# Patient Record
Sex: Female | Born: 1965
Health system: Southern US, Community
[De-identification: ages and names within clinical notes are randomized; demographics above are authoritative.]

## PROBLEM LIST (undated history)

## (undated) DIAGNOSIS — D72819 Decreased white blood cell count, unspecified: Secondary | ICD-10-CM

## (undated) DIAGNOSIS — J45909 Unspecified asthma, uncomplicated: Secondary | ICD-10-CM

## (undated) DIAGNOSIS — D649 Anemia, unspecified: Secondary | ICD-10-CM

## (undated) HISTORY — DX: Decreased white blood cell count, unspecified: D72.819

## (undated) HISTORY — PX: OTHER SURGICAL HISTORY: SHX169

## (undated) HISTORY — PX: COLONOSCOPY: SHX174

## (undated) HISTORY — DX: Unspecified asthma, uncomplicated: J45.909

## (undated) HISTORY — PX: BREAST EXCISIONAL BIOPSY: SUR124

---

## 1991-11-07 HISTORY — PX: DILATION AND CURETTAGE OF UTERUS: SHX78

## 1998-02-08 ENCOUNTER — Other Ambulatory Visit: Admission: RE | Admit: 1998-02-08 | Discharge: 1998-02-08 | Payer: Self-pay | Admitting: Gynecology

## 1999-02-24 ENCOUNTER — Other Ambulatory Visit: Admission: RE | Admit: 1999-02-24 | Discharge: 1999-02-24 | Payer: Self-pay | Admitting: Gynecology

## 1999-09-28 ENCOUNTER — Ambulatory Visit (HOSPITAL_COMMUNITY): Admission: RE | Admit: 1999-09-28 | Discharge: 1999-09-28 | Payer: Self-pay | Admitting: Gynecology

## 1999-09-28 ENCOUNTER — Encounter (INDEPENDENT_AMBULATORY_CARE_PROVIDER_SITE_OTHER): Payer: Self-pay

## 2001-01-22 ENCOUNTER — Other Ambulatory Visit: Admission: RE | Admit: 2001-01-22 | Discharge: 2001-01-22 | Payer: Self-pay | Admitting: Gynecology

## 2001-07-30 ENCOUNTER — Other Ambulatory Visit: Admission: RE | Admit: 2001-07-30 | Discharge: 2001-07-30 | Payer: Self-pay | Admitting: Gynecology

## 2001-10-25 ENCOUNTER — Encounter: Admission: RE | Admit: 2001-10-25 | Discharge: 2001-10-25 | Payer: Self-pay | Admitting: Gynecology

## 2001-10-25 ENCOUNTER — Encounter: Payer: Self-pay | Admitting: Gynecology

## 2002-11-13 ENCOUNTER — Other Ambulatory Visit: Admission: RE | Admit: 2002-11-13 | Discharge: 2002-11-13 | Payer: Self-pay | Admitting: Gynecology

## 2004-03-14 ENCOUNTER — Other Ambulatory Visit: Admission: RE | Admit: 2004-03-14 | Discharge: 2004-03-14 | Payer: Self-pay | Admitting: Gynecology

## 2004-03-24 ENCOUNTER — Encounter: Admission: RE | Admit: 2004-03-24 | Discharge: 2004-03-24 | Payer: Self-pay | Admitting: Gynecology

## 2005-03-21 ENCOUNTER — Other Ambulatory Visit: Admission: RE | Admit: 2005-03-21 | Discharge: 2005-03-21 | Payer: Self-pay | Admitting: Gynecology

## 2005-03-27 ENCOUNTER — Encounter: Admission: RE | Admit: 2005-03-27 | Discharge: 2005-03-27 | Payer: Self-pay | Admitting: Gynecology

## 2006-03-26 ENCOUNTER — Other Ambulatory Visit: Admission: RE | Admit: 2006-03-26 | Discharge: 2006-03-26 | Payer: Self-pay | Admitting: Gynecology

## 2006-04-03 ENCOUNTER — Encounter: Admission: RE | Admit: 2006-04-03 | Discharge: 2006-04-03 | Payer: Self-pay | Admitting: Gynecology

## 2007-04-02 ENCOUNTER — Other Ambulatory Visit: Admission: RE | Admit: 2007-04-02 | Discharge: 2007-04-02 | Payer: Self-pay | Admitting: Gynecology

## 2007-04-05 ENCOUNTER — Encounter: Admission: RE | Admit: 2007-04-05 | Discharge: 2007-04-05 | Payer: Self-pay | Admitting: Gynecology

## 2008-04-06 ENCOUNTER — Encounter: Admission: RE | Admit: 2008-04-06 | Discharge: 2008-04-06 | Payer: Self-pay | Admitting: Gynecology

## 2008-04-14 ENCOUNTER — Encounter: Admission: RE | Admit: 2008-04-14 | Discharge: 2008-04-14 | Payer: Self-pay | Admitting: Gynecology

## 2009-04-06 ENCOUNTER — Encounter: Admission: RE | Admit: 2009-04-06 | Discharge: 2009-04-06 | Payer: Self-pay | Admitting: Gynecology

## 2010-04-08 ENCOUNTER — Encounter: Admission: RE | Admit: 2010-04-08 | Discharge: 2010-04-08 | Payer: Self-pay | Admitting: Gynecology

## 2011-03-07 ENCOUNTER — Other Ambulatory Visit: Payer: Self-pay | Admitting: Gynecology

## 2011-03-07 DIAGNOSIS — Z1231 Encounter for screening mammogram for malignant neoplasm of breast: Secondary | ICD-10-CM

## 2011-04-11 ENCOUNTER — Ambulatory Visit: Payer: Self-pay

## 2011-04-26 ENCOUNTER — Ambulatory Visit
Admission: RE | Admit: 2011-04-26 | Discharge: 2011-04-26 | Disposition: A | Discharge status: Home / Self Care | Payer: 59 | Source: Ambulatory Visit | Attending: Gynecology | Admitting: Gynecology

## 2011-04-26 DIAGNOSIS — Z1231 Encounter for screening mammogram for malignant neoplasm of breast: Secondary | ICD-10-CM

## 2011-08-28 ENCOUNTER — Other Ambulatory Visit: Payer: Self-pay | Admitting: Gynecology

## 2011-08-28 DIAGNOSIS — N6009 Solitary cyst of unspecified breast: Secondary | ICD-10-CM

## 2011-09-07 ENCOUNTER — Ambulatory Visit
Admission: RE | Admit: 2011-09-07 | Discharge: 2011-09-07 | Disposition: A | Discharge status: Home / Self Care | Payer: 59 | Source: Ambulatory Visit | Attending: Gynecology | Admitting: Gynecology

## 2011-09-07 DIAGNOSIS — N6009 Solitary cyst of unspecified breast: Secondary | ICD-10-CM

## 2012-07-31 ENCOUNTER — Other Ambulatory Visit: Payer: Self-pay | Admitting: Gynecology

## 2012-07-31 DIAGNOSIS — Z1231 Encounter for screening mammogram for malignant neoplasm of breast: Secondary | ICD-10-CM

## 2012-08-22 ENCOUNTER — Encounter (HOSPITAL_BASED_OUTPATIENT_CLINIC_OR_DEPARTMENT_OTHER): Payer: Self-pay | Admitting: *Deleted

## 2012-08-23 ENCOUNTER — Encounter (HOSPITAL_BASED_OUTPATIENT_CLINIC_OR_DEPARTMENT_OTHER): Payer: Self-pay | Admitting: *Deleted

## 2012-08-23 NOTE — Progress Notes (Signed)
NPO AFTER MN. ARRIVES AT 0600. NEEDS CBC AND T  & S. REVIEWED [facility] GUIDELINES.

## 2012-08-25 NOTE — Anesthesia Preprocedure Evaluation (Addendum)
Anesthesia Evaluation  Patient identified by MRN, date of birth, ID band Patient awake    Reviewed: Allergy & Precautions, H&P , NPO status , Patient's Chart, lab work & pertinent test results  Airway Mallampati: II TM Distance: >3 FB Neck ROM: Full    Dental No notable dental hx.    Pulmonary neg pulmonary ROS,  breath sounds clear to auscultation  Pulmonary exam normal       Cardiovascular negative cardio ROS  Rhythm:Regular Rate:Normal  Hemoglobin 9.7   Neuro/Psych negative neurological ROS  negative psych ROS   GI/Hepatic negative GI ROS, Neg liver ROS,   Endo/Other  negative endocrine ROS  Renal/GU negative Renal ROS  negative genitourinary   Musculoskeletal negative musculoskeletal ROS (+)   Abdominal   Peds negative pediatric ROS (+)  Hematology negative hematology ROS (+)   Anesthesia Other Findings   Reproductive/Obstetrics negative OB ROS                          Anesthesia Physical Anesthesia Plan  ASA: I  Anesthesia Plan: General   Post-op Pain Management:    Induction: Intravenous  Airway Management Planned: Oral ETT  Additional Equipment:   Intra-op Plan:   Post-operative Plan: Extubation in OR  Informed Consent: I have reviewed the patients History and Physical, chart, labs and discussed the procedure including the risks, benefits and alternatives for the proposed anesthesia with the patient or authorized representative who has indicated his/her understanding and acceptance.   Dental advisory given  Plan Discussed with: CRNA  Anesthesia Plan Comments:        Anesthesia Quick Evaluation

## 2012-08-26 ENCOUNTER — Ambulatory Visit (HOSPITAL_BASED_OUTPATIENT_CLINIC_OR_DEPARTMENT_OTHER): Payer: 59 | Admitting: Anesthesiology

## 2012-08-26 ENCOUNTER — Encounter (HOSPITAL_BASED_OUTPATIENT_CLINIC_OR_DEPARTMENT_OTHER): Payer: Self-pay | Admitting: Anesthesiology

## 2012-08-26 ENCOUNTER — Encounter (HOSPITAL_BASED_OUTPATIENT_CLINIC_OR_DEPARTMENT_OTHER)
Admission: RE | Disposition: A | Discharge status: Home / Self Care | Payer: Self-pay | Source: Ambulatory Visit | Attending: Gynecology

## 2012-08-26 ENCOUNTER — Encounter (HOSPITAL_BASED_OUTPATIENT_CLINIC_OR_DEPARTMENT_OTHER): Payer: Self-pay | Admitting: *Deleted

## 2012-08-26 ENCOUNTER — Ambulatory Visit (HOSPITAL_BASED_OUTPATIENT_CLINIC_OR_DEPARTMENT_OTHER)
Admission: RE | Admit: 2012-08-26 | Discharge: 2012-08-27 | Disposition: A | Discharge status: Home / Self Care | Payer: 59 | Source: Ambulatory Visit | Attending: Gynecology | Admitting: Gynecology

## 2012-08-26 DIAGNOSIS — N920 Excessive and frequent menstruation with regular cycle: Secondary | ICD-10-CM | POA: Insufficient documentation

## 2012-08-26 DIAGNOSIS — N8000 Endometriosis of the uterus, unspecified: Secondary | ICD-10-CM | POA: Insufficient documentation

## 2012-08-26 DIAGNOSIS — D649 Anemia, unspecified: Secondary | ICD-10-CM | POA: Insufficient documentation

## 2012-08-26 DIAGNOSIS — D250 Submucous leiomyoma of uterus: Secondary | ICD-10-CM | POA: Insufficient documentation

## 2012-08-26 HISTORY — PX: VAGINAL HYSTERECTOMY: SHX2639

## 2012-08-26 HISTORY — DX: Anemia, unspecified: D64.9

## 2012-08-26 LAB — CBC
Hemoglobin: 9.7 g/dL — ABNORMAL LOW (ref 12.0–15.0)
MCH: 27.9 pg (ref 26.0–34.0)
MCHC: 33.7 g/dL (ref 30.0–36.0)
MCV: 82.8 fL (ref 78.0–100.0)
RBC: 3.48 MIL/uL — ABNORMAL LOW (ref 3.87–5.11)

## 2012-08-26 LAB — TYPE AND SCREEN

## 2012-08-26 LAB — ABO/RH: ABO/RH(D): O POS

## 2012-08-26 SURGERY — HYSTERECTOMY, VAGINAL
Anesthesia: General | Site: Uterus | Laterality: Bilateral | Wound class: Clean Contaminated

## 2012-08-26 MED ORDER — GLYCOPYRROLATE 0.2 MG/ML IJ SOLN
INTRAMUSCULAR | Status: DC | PRN
  Administered 2012-08-26: 0.6 mg via INTRAVENOUS

## 2012-08-26 MED ORDER — DEXTROSE 5 % IV SOLN
2.0000 g | Freq: Once | INTRAVENOUS | Status: AC
  Administered 2012-08-26: 2 g via INTRAVENOUS

## 2012-08-26 MED ORDER — DIPHENHYDRAMINE HCL 50 MG/ML IJ SOLN: 12.5000 mg | Freq: Four times a day (QID) | INTRAMUSCULAR | Status: DC | PRN

## 2012-08-26 MED ORDER — DIPHENHYDRAMINE HCL 12.5 MG/5ML PO ELIX: 12.5000 mg | ORAL_SOLUTION | Freq: Four times a day (QID) | ORAL | Status: DC | PRN

## 2012-08-26 MED ORDER — PROPOFOL 10 MG/ML IV BOLUS
INTRAVENOUS | Status: DC | PRN
  Administered 2012-08-26: 170 mg via INTRAVENOUS

## 2012-08-26 MED ORDER — ACETAMINOPHEN 10 MG/ML IV SOLN
INTRAVENOUS | Status: DC | PRN
  Administered 2012-08-26: 1000 mg via INTRAVENOUS

## 2012-08-26 MED ORDER — PROMETHAZINE HCL 25 MG/ML IJ SOLN: 6.2500 mg | INTRAMUSCULAR | Status: DC | PRN

## 2012-08-26 MED ORDER — DEXTROSE-NACL 5-0.45 % IV SOLN
INTRAVENOUS | Status: DC
  Administered 2012-08-26 – 2012-08-27 (×2): via INTRAVENOUS

## 2012-08-26 MED ORDER — ONDANSETRON HCL 4 MG/2ML IJ SOLN: 4.0000 mg | Freq: Four times a day (QID) | INTRAMUSCULAR | Status: DC | PRN

## 2012-08-26 MED ORDER — NALOXONE HCL 0.4 MG/ML IJ SOLN: 0.4000 mg | INTRAMUSCULAR | Status: DC | PRN

## 2012-08-26 MED ORDER — ONDANSETRON HCL 4 MG/2ML IJ SOLN
INTRAMUSCULAR | Status: DC | PRN
  Administered 2012-08-26: 4 mg via INTRAVENOUS

## 2012-08-26 MED ORDER — DEXAMETHASONE SODIUM PHOSPHATE 4 MG/ML IJ SOLN
INTRAMUSCULAR | Status: DC | PRN
  Administered 2012-08-26: 10 mg via INTRAVENOUS

## 2012-08-26 MED ORDER — FENTANYL CITRATE 0.05 MG/ML IJ SOLN
INTRAMUSCULAR | Status: DC | PRN
  Administered 2012-08-26: 100 ug via INTRAVENOUS

## 2012-08-26 MED ORDER — FENTANYL CITRATE 0.05 MG/ML IJ SOLN: 25.0000 ug | INTRAMUSCULAR | Status: DC | PRN

## 2012-08-26 MED ORDER — ROCURONIUM BROMIDE 100 MG/10ML IV SOLN
INTRAVENOUS | Status: DC | PRN
Start: 2012-08-26 — End: 2012-08-26
  Administered 2012-08-26: 40 mg via INTRAVENOUS

## 2012-08-26 MED ORDER — EPHEDRINE SULFATE 50 MG/ML IJ SOLN
INTRAMUSCULAR | Status: DC | PRN
  Administered 2012-08-26 (×2): 10 mg via INTRAVENOUS

## 2012-08-26 MED ORDER — ACETAMINOPHEN 10 MG/ML IV SOLN
1000.0000 mg | Freq: Four times a day (QID) | INTRAVENOUS | Status: AC
  Administered 2012-08-26 – 2012-08-27 (×4): 1000 mg via INTRAVENOUS

## 2012-08-26 MED ORDER — LIDOCAINE-EPINEPHRINE (PF) 1 %-1:200000 IJ SOLN
INTRAMUSCULAR | Status: DC | PRN
  Administered 2012-08-26: 15 mL

## 2012-08-26 MED ORDER — LACTATED RINGERS IV SOLN
INTRAVENOUS | Status: DC
  Administered 2012-08-26: 100 mL/h via INTRAVENOUS

## 2012-08-26 MED ORDER — CELECOXIB 200 MG PO CAPS
200.0000 mg | ORAL_CAPSULE | Freq: Every day | ORAL | Status: DC
  Administered 2012-08-26: 200 mg via ORAL

## 2012-08-26 MED ORDER — MORPHINE SULFATE (PF) 1 MG/ML IV SOLN: INTRAVENOUS | Status: DC

## 2012-08-26 MED ORDER — MIDAZOLAM HCL 5 MG/5ML IJ SOLN
INTRAMUSCULAR | Status: DC | PRN
  Administered 2012-08-26: 2 mg via INTRAVENOUS

## 2012-08-26 MED ORDER — NEOSTIGMINE METHYLSULFATE 1 MG/ML IJ SOLN
INTRAMUSCULAR | Status: DC | PRN
  Administered 2012-08-26: 3 mg via INTRAVENOUS

## 2012-08-26 MED ORDER — LIDOCAINE HCL (CARDIAC) 20 MG/ML IV SOLN
INTRAVENOUS | Status: DC | PRN
  Administered 2012-08-26: 80 mg via INTRAVENOUS

## 2012-08-26 MED ORDER — SODIUM CHLORIDE 0.9 % IJ SOLN: 9.0000 mL | INTRAMUSCULAR | Status: DC | PRN

## 2012-08-26 MED ORDER — PROMETHAZINE HCL 25 MG/ML IJ SOLN
12.5000 mg | INTRAMUSCULAR | Status: DC | PRN
  Administered 2012-08-26: 6.25 mg via INTRAVENOUS

## 2012-08-26 MED ORDER — HYDROMORPHONE HCL PF 1 MG/ML IJ SOLN: 0.5000 mg | INTRAMUSCULAR | Status: DC | PRN

## 2012-08-26 MED ORDER — OXYCODONE HCL 5 MG PO TABS
5.0000 mg | ORAL_TABLET | ORAL | Status: DC | PRN
  Administered 2012-08-26: 5 mg via ORAL

## 2012-08-26 SURGICAL SUPPLY — 46 items
BAG URINE DRAINAGE (UROLOGICAL SUPPLIES) ×3 IMPLANT
BLADE SURG 10 STRL SS (BLADE) ×6 IMPLANT
CANISTER SUCTION 2500CC (MISCELLANEOUS) ×3 IMPLANT
CATH FOLEY 2WAY SLVR  5CC 16FR (CATHETERS) ×1
CATH FOLEY 2WAY SLVR 5CC 16FR (CATHETERS) ×2 IMPLANT
CLOTH BEACON ORANGE TIMEOUT ST (SAFETY) ×3 IMPLANT
COVER MAYO STAND STRL (DRAPES) ×3 IMPLANT
COVER TABLE BACK 60X90 (DRAPES) ×3 IMPLANT
DRAPE LG THREE QUARTER DISP (DRAPES) ×6 IMPLANT
DRAPE UNDERBUTTOCKS STRL (DRAPE) ×3 IMPLANT
ELECT REM PT RETURN 9FT ADLT (ELECTROSURGICAL) ×3
ELECTRODE REM PT RTRN 9FT ADLT (ELECTROSURGICAL) ×2 IMPLANT
GAUZE SPONGE 4X4 16PLY XRAY LF (GAUZE/BANDAGES/DRESSINGS) IMPLANT
GLOVE BIO SURGEON STRL SZ 6.5 (GLOVE) ×3 IMPLANT
GLOVE BIO SURGEON STRL SZ7.5 (GLOVE) ×3 IMPLANT
GLOVE BIO SURGEON STRL SZ8 (GLOVE) ×9 IMPLANT
GOWN PREVENTION PLUS LG XLONG (DISPOSABLE) ×6 IMPLANT
GOWN STRL REIN XL XLG (GOWN DISPOSABLE) ×6 IMPLANT
LEGGING LITHOTOMY PAIR STRL (DRAPES) ×3 IMPLANT
NEEDLE HYPO 22GX1.5 SAFETY (NEEDLE) ×3 IMPLANT
NS IRRIG 500ML POUR BTL (IV SOLUTION) ×3 IMPLANT
PACK BASIN DAY SURGERY FS (CUSTOM PROCEDURE TRAY) ×3 IMPLANT
PACKING VAGINAL (PACKING) IMPLANT
PAD OB MATERNITY 4.3X12.25 (PERSONAL CARE ITEMS) ×3 IMPLANT
PAD PREP 24X48 CUFFED NSTRL (MISCELLANEOUS) ×3 IMPLANT
PENCIL BUTTON HOLSTER BLD 10FT (ELECTRODE) ×3 IMPLANT
SPONGE LAP 4X18 X RAY DECT (DISPOSABLE) IMPLANT
SUT MON AB-0 CT1 36 (SUTURE) ×3 IMPLANT
SUT NOVA NAB GS-21 0 18 T12 DT (SUTURE) ×3 IMPLANT
SUT NOVA NAB GS-22 2 0 T19 (SUTURE) IMPLANT
SUT VIC AB 0 CT1 18XCR BRD 8 (SUTURE) ×4 IMPLANT
SUT VIC AB 0 CT1 36 (SUTURE) ×6 IMPLANT
SUT VIC AB 0 CT1 8-18 (SUTURE) ×2
SUT VIC AB 0 CT2 27 (SUTURE) IMPLANT
SUT VIC AB 2-0 CT2 27 (SUTURE) IMPLANT
SUT VIC AB 2-0 SH 27 (SUTURE)
SUT VIC AB 2-0 SH 27XBRD (SUTURE) IMPLANT
SUT VICRYL 0 TIES 12 18 (SUTURE) ×3 IMPLANT
SYR BULB IRRIGATION 50ML (SYRINGE) ×3 IMPLANT
SYR CONTROL 10ML LL (SYRINGE) ×3 IMPLANT
SYRINGE 10CC LL (SYRINGE) ×3 IMPLANT
TOWEL OR 17X24 6PK STRL BLUE (TOWEL DISPOSABLE) ×6 IMPLANT
TRAY DSU PREP LF (CUSTOM PROCEDURE TRAY) ×3 IMPLANT
TUBE CONNECTING 12X1/4 (SUCTIONS) ×3 IMPLANT
WATER STERILE IRR 500ML POUR (IV SOLUTION) ×3 IMPLANT
YANKAUER SUCT BULB TIP NO VENT (SUCTIONS) ×3 IMPLANT

## 2012-08-26 NOTE — Anesthesia Procedure Notes (Signed)
Procedure Name: Intubation Date/Time: 08/26/2012 8:18 AM Performed by: Maris Berger T Pre-anesthesia Checklist: Patient identified, Emergency Drugs available, Suction available and Patient being monitored Patient Re-evaluated:Patient Re-evaluated prior to inductionOxygen Delivery Method: Circle System Utilized Preoxygenation: Pre-oxygenation with 100% oxygen Intubation Type: IV induction Ventilation: Mask ventilation without difficulty Laryngoscope Size: 3 Grade View: Grade II Tube type: Oral Number of attempts: 1 Airway Equipment and Method: stylet Placement Confirmation: ETT inserted through vocal cords under direct vision,  positive ETCO2 and breath sounds checked- equal and bilateral Secured at: 21 cm Tube secured with: Tape Dental Injury: Teeth and Oropharynx as per pre-operative assessment

## 2012-08-26 NOTE — Progress Notes (Signed)
Patient voided 500 cc of clear yellow urine in commode.

## 2012-08-26 NOTE — Progress Notes (Signed)
Assessment completed. Patient alert able to verbalize needs. Respirations even non labored B.S positive all 4 quads. Denies any nausea or vomiting at this time. Peri pad CDI. Call light in reach. Rates pain at a scale of 1/10 will continue to monitor.

## 2012-08-26 NOTE — Transfer of Care (Signed)
Immediate Anesthesia Transfer of Care Note  Patient: Sheila Bentley  Procedure(s) Performed: Procedure(s) (LRB) with comments: HYSTERECTOMY VAGINAL (Bilateral) - Possible Bilateral Salpingo Oophorectomy   Patient Location: PACU  Anesthesia Type: General  Level of Consciousness: sedated and responds to stimulation  Airway & Oxygen Therapy: Patient Spontanous Breathing and Patient connected to nasal cannula oxygen  Post-op Assessment: Report given to PACU RN  Post vital signs: Reviewed and stable   Complications: No apparent anesthesia complications

## 2012-08-26 NOTE — Anesthesia Postprocedure Evaluation (Signed)
  Anesthesia Post-op Note  Patient: Sheila Bentley  Procedure(s) Performed: Procedure(s) (LRB): HYSTERECTOMY VAGINAL (Bilateral)  Patient Location: PACU  Anesthesia Type: General  Level of Consciousness: awake and alert   Airway and Oxygen Therapy: Patient Spontanous Breathing  Post-op Pain: mild  Post-op Assessment: Post-op Vital signs reviewed, Patient's Cardiovascular Status Stable, Respiratory Function Stable, Patent Airway and No signs of Nausea or vomiting  Post-op Vital Signs: stable  Complications: No apparent anesthesia complications

## 2012-08-27 MED ORDER — CELECOXIB 200 MG PO CAPS: 200.0000 mg | ORAL_CAPSULE | Freq: Every day | ORAL | Status: DC

## 2012-08-27 NOTE — Op Note (Signed)
Sheila Bentley, Sheila Bentley NO.:  [id number]  MEDICAL RECORD NO.:  [id number]  LOCATION:                                 FACILITY:  PHYSICIAN:  Gretta Cool, M.D. DATE OF BIRTH:  April 19, 1966  DATE OF PROCEDURE: DATE OF DISCHARGE:                              OPERATIVE REPORT   PREOPERATIVE DIAGNOSES:  Uterine leiomyomata, submucous with extreme menorrhagia and severe anemia.  POSTOP DIAGNOSIS:  Uterine leiomyomata, submucous with extreme menorrhagia and severe anemia.  PROCEDURE:  Vaginal hysterectomy.  ANESTHESIA:  General orotracheal.  SURGEON:  Gretta Cool, M.D.  ASSISTANT:  Dr. Luvenia Redden.  DESCRIPTION OF PROCEDURE:  Under excellent general anesthesia with the patient prepped and draped in Allen stirrups, a weighted speculum was placed in the vagina and the cervix grasped with single-tooth tenaculum. The cervix was then infiltrated with Xylocaine 1% with epinephrine 1:200,000.  The cervical mucosa was then circumcised and pushed off the lower uterine segment.  The cul-de-sac was then exposed and opened with Mayo scissors.  The uterosacral ligaments were clamped, cut, sutured and tied with 0 Vicryl.  The cardinal ligaments were likewise clamped, cut, sutured and tied with 0 Vicryl.  The anterior vesicovaginal plica was then opened and a Deaver placed beneath the bladder.  The uterine vessels were then clamped, cut, sutured and tied with 0 Vicryl.  At this point, the upper pedicles above the cardinals were clamped, cut, sutured and tied.  At this point, the uterus could be inverted and the adnexal pedicles were clamped across, cut, sutured and doubly ligated with 0 Vicryl.  At this point, the uterus was delivered.  The ovaries appeared normal.  There was no other evidence of adhesion or pelvic pathology. At this point, the cuff was closed with a running suture of #2-0 Novafil.  The pursestring was then tied.  The cardinal-uterosacral ligaments  were then fixed to the vaginal angle with interrupted sutures of #2-0 Novafil on each side, so as to support the cardinal-uterosacral complex to the vaginal cuff.  The cuff was then approximated with a running suture of #0 Vicryl from the right angle to the left, so as to close the transversely fascia to fascia.  At this point, her bleeding was well controlled.  There were no complications.  The patient returned to recovery room in excellent condition.  ESTIMATED BLOOD LOSS:  Less than 50 mL.  COMPLICATIONS:  None.          ______________________________ Gretta Cool, M.D.     CWL/MEDQ  D:  08/26/2012  T:  08/27/2012  Job:  [id number]

## 2012-08-27 NOTE — Progress Notes (Signed)
Patient continues to rest quietly easy to arouse denies any increase in pain level rates pain at a 1/10. No complaints of dizziness or light headedness taking p.o fluids well voiding without difficulty. No  Bleeding noted on peri pad will continue to monitor.

## 2012-08-28 ENCOUNTER — Encounter (HOSPITAL_BASED_OUTPATIENT_CLINIC_OR_DEPARTMENT_OTHER): Payer: Self-pay | Admitting: Gynecology

## 2012-09-12 ENCOUNTER — Ambulatory Visit
Admission: RE | Admit: 2012-09-12 | Discharge: 2012-09-12 | Disposition: A | Discharge status: Home / Self Care | Payer: 59 | Source: Ambulatory Visit | Attending: Gynecology | Admitting: Gynecology

## 2012-09-12 DIAGNOSIS — Z1231 Encounter for screening mammogram for malignant neoplasm of breast: Secondary | ICD-10-CM

## 2013-05-20 ENCOUNTER — Ambulatory Visit (INDEPENDENT_AMBULATORY_CARE_PROVIDER_SITE_OTHER): Payer: Self-pay | Admitting: Gynecology

## 2013-05-20 ENCOUNTER — Encounter: Payer: Self-pay | Admitting: Gynecology

## 2013-05-20 ENCOUNTER — Telehealth: Payer: Self-pay | Admitting: *Deleted

## 2013-05-20 VITALS — BP 124/76 | Ht 66.0 in | Wt 135.0 lb

## 2013-05-20 DIAGNOSIS — D649 Anemia, unspecified: Secondary | ICD-10-CM | POA: Insufficient documentation

## 2013-05-20 DIAGNOSIS — N63 Unspecified lump in unspecified breast: Secondary | ICD-10-CM

## 2013-05-20 DIAGNOSIS — Z01419 Encounter for gynecological examination (general) (routine) without abnormal findings: Secondary | ICD-10-CM

## 2013-05-20 DIAGNOSIS — Z23 Encounter for immunization: Secondary | ICD-10-CM

## 2013-05-20 DIAGNOSIS — Z1159 Encounter for screening for other viral diseases: Secondary | ICD-10-CM

## 2013-05-20 DIAGNOSIS — N6009 Solitary cyst of unspecified breast: Secondary | ICD-10-CM | POA: Insufficient documentation

## 2013-05-20 MED ORDER — METRONIDAZOLE 500 MG PO TABS: 500.0000 mg | ORAL_TABLET | Freq: Two times a day (BID) | ORAL | Status: DC

## 2013-05-20 NOTE — Addendum Note (Signed)
Addended by: Bertram Savin A on: 05/20/2013 12:03 PM   Modules accepted: Orders

## 2013-05-20 NOTE — Patient Instructions (Addendum)

## 2013-05-20 NOTE — Telephone Encounter (Signed)
Order placed for u/s right breast, breast center will contact patient.

## 2013-05-20 NOTE — Telephone Encounter (Signed)
Message copied by Aura Camps on Tue May 20, 2013 12:40 PM ------      Message from: Ok Edwards      Created: Tue May 20, 2013 10:59 AM       Patient needs bilateral ultrasound to compare with previous study of 2012 which demonstrated the following:            Ultrasound is performed, showing multiple bilateral simple cysts      accounting for the palpable abnormalities as seen on the left at      the 12 o'clock, 1 o'clock, 3 o'clock and 6 o'clock positions and on      the right at the 1 o'clock, 5 o'clock and 6 o'clock positions            Mammogram Nov 2013 normal/dense breast            Exam today right breast nodularitries noted at 12, 3 and 6 o'clock position of right breast and 12 o'clock left breast. ------

## 2013-05-20 NOTE — Progress Notes (Signed)
Sheila Bentley 01/27/1966 [id number]   History:    47 y.o.  for annual gyn exam who is a new patient to our practice. Patient previously was followed by Dr. Nicholas Lose who had done a transvaginal hysterectomy in 2013 as a result of patient's menorrhagia and anemia history. The patient has done well from her surgery. She has been on R. Supplementation. Her primary physician is in [place], [place]. Who has been doing her blood work and was done recently. Review of her records indicated that in 2012 for bilateral breast nodularity since she had a mammogram and ultrasound which the findings demonstrated bilateral simple cysts. In 2013 her mammogram was described as normal. Patient with no prior history of abnormal Pap smears.  Past medical history,surgical history, family history and social history were all reviewed and documented in the EPIC chart.  Gynecologic History Patient's last menstrual period was 07/21/2012. Contraception: status post hysterectomy Last Pap: 2013. Results were: normal Last mammogram: see above. Results were: see above  Obstetric History OB History   Grav Para Term Preterm Abortions TAB SAB Ect Mult Living   2 1   1  1   1      # Outc Date GA Lbr Len/2nd Wgt Sex Del Anes PTL Lv   1 PAR            2 SAB                ROS: A ROS was performed and pertinent positives and negatives are included in the history.  GENERAL: No fevers or chills. HEENT: No change in vision, no earache, sore throat or sinus congestion. NECK: No pain or stiffness. CARDIOVASCULAR: No chest pain or pressure. No palpitations. PULMONARY: No shortness of breath, cough or wheeze. GASTROINTESTINAL: No abdominal pain, nausea, vomiting or diarrhea, melena or bright red blood per rectum. GENITOURINARY: No urinary frequency, urgency, hesitancy or dysuria. MUSCULOSKELETAL: No joint or muscle pain, no back pain, no recent trauma. DERMATOLOGIC: No rash, no itching, no lesions. ENDOCRINE: No polyuria, polydipsia, no heat or  cold intolerance. No recent change in weight. HEMATOLOGICAL: No anemia or easy bruising or bleeding. NEUROLOGIC: No headache, seizures, numbness, tingling or weakness. PSYCHIATRIC: No depression, no loss of interest in normal activity or change in sleep pattern.     Exam: chaperone present  BP 124/76  Ht 5\' 6"  (1.676 m)  Wt 135 lb (61.236 kg)  BMI 21.8 kg/m2  LMP 07/21/2012  Body mass index is 21.8 kg/(m^2).  General appearance : Well developed well nourished female. No acute distress HEENT: Neck supple, trachea midline, no carotid bruits, no thyroidmegaly Lungs: Clear to auscultation, no rhonchi or wheezes, or rib retractions  Heart: Regular rate and rhythm, no murmurs or gallops Breast:Examined in sitting and supine position were symmetrical in appearance, no palpable masses or tenderness,  no skin retraction, no nipple inversion, no nipple discharge, no skin discoloration, no axillary or supraclavicular lymphadenopathy Abdomen: no palpable masses or tenderness, no rebound or guarding Extremities: no edema or skin discoloration or tenderness  Pelvic:  Bartholin, Urethra, Skene Glands: Within normal limits             Vagina: No gross lesions or discharge  Cervix: absence  Uterus absence  Adnexa  Without masses or tenderness  Anus and perineum  normal   Rectovaginal  normal sphincter tone without palpated masses or tenderness             Hemoccult None indicated     Assessment/Plan:  47 y.o. female for annual exam who will need bilateral ultrasound to compare with previous study of 2012 which demonstrated the following:   Ultrasound  Was  performed, showing multiple bilateral simple cysts accounting for the palpable abnormalities as seen on the left at the 12 o'clock, 1 o'clock, 3 o'clock and 6 o'clock positions and on the right at the 1 o'clock, 5 o'clock and 6 o'clock positions Mammogram Nov 2013 normal/dense breast:   Exam today right breast nodularitries noted at 12, 3 and  6 o'clock position of right breast and 12 o'clock left breast.  We will await further results of the ultrasound if stability on breast cyst is present she will not need her mammogram until the end of the year.  No Pap smear done today the new guidelines were discussed. Patient did receive the Tdap vaccine.  New CDC guidelines is recommending patients be tested once in her lifetime for hepatitis C antibody who were born between [birth year] through 1967. This was discussed with the patient today and has agreed to be tested today.    Ok Edwards MD, 11:08 AM 05/20/2013

## 2013-05-21 ENCOUNTER — Other Ambulatory Visit: Payer: Self-pay | Admitting: Gynecology

## 2013-05-21 DIAGNOSIS — N63 Unspecified lump in unspecified breast: Secondary | ICD-10-CM

## 2013-05-26 NOTE — Telephone Encounter (Signed)
Appt. 06/03/13 2 7:30 am

## 2013-06-03 ENCOUNTER — Ambulatory Visit
Admission: RE | Admit: 2013-06-03 | Discharge: 2013-06-03 | Disposition: A | Discharge status: Home / Self Care | Payer: 59 | Source: Ambulatory Visit | Attending: Gynecology | Admitting: Gynecology

## 2013-06-03 DIAGNOSIS — N63 Unspecified lump in unspecified breast: Secondary | ICD-10-CM

## 2013-08-04 ENCOUNTER — Other Ambulatory Visit: Payer: Self-pay

## 2013-08-04 DIAGNOSIS — Z1231 Encounter for screening mammogram for malignant neoplasm of breast: Secondary | ICD-10-CM

## 2013-09-15 ENCOUNTER — Ambulatory Visit
Admission: RE | Admit: 2013-09-15 | Discharge: 2013-09-15 | Disposition: A | Discharge status: Home / Self Care | Payer: 59 | Source: Ambulatory Visit

## 2013-09-15 DIAGNOSIS — Z1231 Encounter for screening mammogram for malignant neoplasm of breast: Secondary | ICD-10-CM

## 2014-05-21 ENCOUNTER — Encounter: Payer: Self-pay | Admitting: Gynecology

## 2014-05-21 ENCOUNTER — Ambulatory Visit (INDEPENDENT_AMBULATORY_CARE_PROVIDER_SITE_OTHER): Payer: 59 | Admitting: Gynecology

## 2014-05-21 VITALS — BP 124/78 | Ht 64.0 in | Wt 134.0 lb

## 2014-05-21 DIAGNOSIS — Z01419 Encounter for gynecological examination (general) (routine) without abnormal findings: Secondary | ICD-10-CM

## 2014-05-21 DIAGNOSIS — N63 Unspecified lump in unspecified breast: Secondary | ICD-10-CM

## 2014-05-21 NOTE — Patient Instructions (Signed)
Hormone Therapy At menopause, your body begins making less estrogen and progesterone hormones. This causes the body to stop having menstrual periods. This is because estrogen and progesterone hormones control your periods and menstrual cycle. A lack of estrogen may cause symptoms such as:  Hot flushes (or hot flashes).  Vaginal dryness.  Dry skin.  Loss of sex drive.  Risk of bone loss (osteoporosis). When this happens, you may choose to take hormone therapy to get back the estrogen lost during menopause. When the hormone estrogen is given alone, it is usually referred to as ET (Estrogen Therapy). When the hormone progestin is combined with estrogen, it is generally called HT (Hormone Therapy). This was formerly known as hormone replacement therapy (HRT). Your caregiver can help you make a decision on what will be best for you. The decision to use HT seems to change often as new studies are done. Many studies do not agree on the benefits of hormone replacement therapy. LIKELY BENEFITS OF HT INCLUDE PROTECTION FROM:  Hot Flushes (also called hot flashes) - A hot flush is a sudden feeling of heat that spreads over the face and body. The skin may redden like a blush. It is connected with sweats and sleep disturbance. Women going through menopause may have hot flushes a few times a month or several times per day depending on the woman.  Osteoporosis (bone loss)- Estrogen helps guard against bone loss. After menopause, a woman's bones slowly lose calcium and become weak and brittle. As a result, bones are more likely to break. The hip, wrist, and spine are affected most often. Hormone therapy can help slow bone loss after menopause. Weight bearing exercise and taking calcium with vitamin D also can help prevent bone loss. There are also medications that your caregiver can prescribe that can help prevent osteoporosis.  Vaginal Dryness - Loss of estrogen causes changes in the vagina. Its lining may  become thin and dry. These changes can cause pain and bleeding during sexual intercourse. Dryness can also lead to infections. This can cause burning and itching. (Vaginal estrogen treatment can help relieve pain, itching, and dryness.)  Urinary Tract Infections are more common after menopause because of lack of estrogen. Some women also develop urinary incontinence because of low estrogen levels in the vagina and bladder.  Possible other benefits of estrogen include a positive effect on mood and short-term memory in women. RISKS AND COMPLICATIONS  Using estrogen alone without progesterone causes the lining of the uterus to grow. This increases the risk of lining of the uterus (endometrial) cancer. Your caregiver should give another hormone called progestin if you have a uterus.  Women who take combined (estrogen and progestin) HT appear to have an increased risk of breast cancer. The risk appears to be small, but increases throughout the time that HT is taken.  Combined therapy also makes the breast tissue slightly denser which makes it harder to read mammograms (breast X-rays).  Combined, estrogen and progesterone therapy can be taken together every day, in which case there may be spotting of blood. HT therapy can be taken cyclically in which case you will have menstrual periods. Cyclically means HT is taken for a set amount of days, then not taken, then this process is repeated.  HT may increase the risk of stroke, heart attack, breast cancer and forming blood clots in your leg.  Transdermal estrogen (estrogen that is absorbed through the skin with a patch or a cream) may have more positive results with:    Cholesterol.  Blood pressure.  Blood clots. Having the following conditions may indicate you should not have HT:  Endometrial cancer.  Liver disease.  Breast cancer.  Heart disease.  History of blood clots.  Stroke. TREATMENT   If you choose to take HT and have a uterus,  usually estrogen and progestin are prescribed.  Your caregiver will help you decide the best way to take the medications.  Possible ways to take estrogen include:  Pills.  Patches.  Gels.  Sprays.  Vaginal estrogen cream, rings and tablets.  It is best to take the lowest dose possible that will help your symptoms and take them for the shortest period of time that you can.  Hormone therapy can help relieve some of the problems (symptoms) that affect women at menopause. Before making a decision about HT, talk to your caregiver about what is best for you. Be well informed and comfortable with your decisions. HOME CARE INSTRUCTIONS   Follow your caregivers advice when taking the medications.  A Pap test is done to screen for cervical cancer.  The first Pap test should be done at age 21.  Between ages 21 and 29, Pap tests are repeated every 2 years.  Beginning at age 30, you are advised to have a Pap test every 3 years as long as your past 3 Pap tests have been normal.  Some women have medical problems that increase the chance of getting cervical cancer. Talk to your caregiver about these problems. It is especially important to talk to your caregiver if a new problem develops soon after your last Pap test. In these cases, your caregiver may recommend more frequent screening and Pap tests.  The above recommendations are the same for women who have or have not gotten the vaccine for HPV (Human Papillomavirus).  If you had a hysterectomy for a problem that was not a cancer or a condition that could lead to cancer, then you no longer need Pap tests. However, even if you no longer need a Pap test, a regular exam is a good idea to make sure no other problems are starting.   If you are between ages 65 and 70, and you have had normal Pap tests going back 10 years, you no longer need Pap tests. However, even if you no longer need a Pap test, a regular exam is a good idea to make sure no  other problems are starting.   If you have had past treatment for cervical cancer or a condition that could lead to cancer, you need Pap tests and screening for cancer for at least 20 years after your treatment.  If Pap tests have been discontinued, risk factors (such as a new sexual partner) need to be re-assessed to determine if screening should be resumed.  Some women may need screenings more often if they are at high risk for cervical cancer.  Get mammograms done as per the advice of your caregiver. SEEK IMMEDIATE MEDICAL CARE IF:  You develop abnormal vaginal bleeding.  You have pain or swelling in your legs, shortness of breath, or chest pain.  You develop dizziness or headaches.  You have lumps or changes in your breasts or armpits.  You have slurred speech.  You develop weakness or numbness of your arms or legs.  You have pain, burning, or bleeding when urinating.  You develop abdominal pain. Document Released: 07/22/2003 Document Revised: 01/15/2012 Document Reviewed: 11/09/2010 Exit[employer] Patient Information 2015 [employer]. This information is not intended to   replace advice given to you by your health care provider. Make sure you discuss any questions you have with your health care provider. Perimenopause Perimenopause is the time when your body begins to move into the menopause (no menstrual period for 12 straight months). It is a natural process. Perimenopause can begin 2-8 years before the menopause and usually lasts for 1 year after the menopause. During this time, your ovaries may or may not produce an egg. The ovaries vary in their production of estrogen and progesterone hormones each month. This can cause irregular menstrual periods, difficulty getting pregnant, vaginal bleeding between periods, and uncomfortable symptoms. CAUSES  Irregular production of the ovarian hormones, estrogen and progesterone, and not ovulating every month.  Other causes  include:  Tumor of the pituitary gland in the brain.  Medical disease that affects the ovaries.  Radiation treatment.  Chemotherapy.  Unknown causes.  Heavy smoking and excessive alcohol intake can bring on perimenopause sooner. SIGNS AND SYMPTOMS   Hot flashes.  Night sweats.  Irregular menstrual periods.  Decreased sex drive.  Vaginal dryness.  Headaches.  Mood swings.  Depression.  Memory problems.  Irritability.  Tiredness.  Weight gain.  Trouble getting pregnant.  The beginning of losing bone cells (osteoporosis).  The beginning of hardening of the arteries (atherosclerosis). DIAGNOSIS  Your health care provider will make a diagnosis by analyzing your age, menstrual history, and symptoms. He or she will do a physical exam and note any changes in your body, especially your female organs. Female hormone tests may or may not be helpful depending on the amount of female hormones you produce and when you produce them. However, other hormone tests may be helpful to rule out other problems. TREATMENT  In some cases, no treatment is needed. The decision on whether treatment is necessary during the perimenopause should be made by you and your health care provider based on how the symptoms are affecting you and your lifestyle. Various treatments are available, such as:  Treating individual symptoms with a specific medicine for that symptom.  Herbal medicines that can help specific symptoms.  Counseling.  Group therapy. HOME CARE INSTRUCTIONS   Keep track of your menstrual periods (when they occur, how heavy they are, how long between periods, and how long they last) as well as your symptoms and when they started.  Only take over-the-counter or prescription medicines as directed by your health care provider.  Sleep and rest.  Exercise.  Eat a diet that contains calcium (good for your bones) and soy (acts like the estrogen hormone).  Do not smoke.  Avoid  alcoholic beverages.  Take vitamin supplements as recommended by your health care provider. Taking vitamin E may help in certain cases.  Take calcium and vitamin D supplements to help prevent bone loss.  Group therapy is sometimes helpful.  Acupuncture may help in some cases. SEEK MEDICAL CARE IF:   You have questions about any symptoms you are having.  You need a referral to a specialist (gynecologist, psychiatrist, or psychologist). SEEK IMMEDIATE MEDICAL CARE IF:   You have vaginal bleeding.  Your period lasts longer than 8 days.  Your periods are recurring sooner than 21 days.  You have bleeding after intercourse.  You have severe depression.  You have pain when you urinate.  You have severe headaches.  You have vision problems. Document Released: 11/30/2004 Document Revised: 08/13/2013 Document Reviewed: 05/22/2013 Exit[employer] Patient Information 2015 [employer]. This information is not intended to replace advice given to   to you by your health care provider. Make sure you discuss any questions you have with your health care provider.  

## 2014-05-21 NOTE — Progress Notes (Signed)
Sheila Bentley Dec 25, 1965 [id number]   History:    48 y.o.  for annual gyn exam with the only concern has been some tenderness on the left outer aspect of her breast and she thought a few weeks ago she focal nodule but it has gotten smaller gone away. She denies any nipple discharge. Patient was seen last year as a new patient she was previously been followed by Dr. Ubaldo Glassing who had done a transvaginal hysterectomy in 2013 as a result of her menorrhagia and anemia history. She has done well from her surgery. She continues to take iron supplementation daily. Review of her records indicated that her primary physician is in [place], [place]. Who has been doing her blood work and was done recently. Review of her records indicated that in 2012 for bilateral breast nodularity since she had a mammogram and ultrasound which the findings demonstrated bilateral simple cysts. In 2013 and 2014 her mammogram was described as normal. Patient with no prior history of abnormal Pap smears.   Past medical history,surgical history, family history and social history were all reviewed and documented in the EPIC chart.  Gynecologic History Patient's last menstrual period was 07/21/2012. Contraception: status post hysterectomy Last Pap: 2013. Results were: Normal  Last mammogram: 2014. Results were: See above  Obstetric History OB History  Gravida Para Term Preterm AB SAB TAB Ectopic Multiple Living  2 1   1 1    1     # Outcome Date GA Lbr Len/2nd Weight Sex Delivery Anes PTL Lv  2 SAB           1 PAR                ROS: A ROS was performed and pertinent positives and negatives are included in the history.  GENERAL: No fevers or chills. HEENT: No change in vision, no earache, sore throat or sinus congestion. NECK: No pain or stiffness. CARDIOVASCULAR: No chest pain or pressure. No palpitations. PULMONARY: No shortness of breath, cough or wheeze. GASTROINTESTINAL: No abdominal pain, nausea, vomiting or diarrhea,  melena or bright red blood per rectum. GENITOURINARY: No urinary frequency, urgency, hesitancy or dysuria. MUSCULOSKELETAL: No joint or muscle pain, no back pain, no recent trauma. DERMATOLOGIC: No rash, no itching, no lesions. ENDOCRINE: No polyuria, polydipsia, no heat or cold intolerance. No recent change in weight. HEMATOLOGICAL: No anemia or easy bruising or bleeding. NEUROLOGIC: No headache, seizures, numbness, tingling or weakness. PSYCHIATRIC: No depression, no loss of interest in normal activity or change in sleep pattern.     Exam: chaperone present  BP 124/78  Ht 5\' 4"  (1.626 m)  Wt 134 lb (60.782 kg)  BMI 22.99 kg/m2  LMP 07/21/2012  Body mass index is 22.99 kg/(m^2).  General appearance : Well developed well nourished female. No acute distress HEENT: Neck supple, trachea midline, no carotid bruits, no thyroidmegaly Lungs: Clear to auscultation, no rhonchi or wheezes, or rib retractions  Heart: Regular rate and rhythm, no murmurs or gallops Breast:Examined in sitting and supine position were symmetrical in appearance, no palpable masses or tenderness,  no skin retraction, no nipple inversion, no nipple discharge, no skin discoloration, no axillary or supraclavicular lymphadenopathy Abdomen: no palpable masses or tenderness, no rebound or guarding Extremities: no edema or skin discoloration or tenderness  Pelvic:  Bartholin, Urethra, Skene Glands: Within normal limits             Vagina: No gross lesions or discharge  Cervix: Absent  Uterus absent  Adnexa  Without masses or tenderness  Anus and perineum  normal   Rectovaginal  normal sphincter tone without palpated masses or tenderness             Hemoccult cards provided     Assessment/Plan:  48 y.o. female for annual exam with past history of multicystic breast. No discernible mass on today's exam or any palpable tenderness or any supraclavicular axillary or lymphadenopathy. I will last patient to continue to do her  monthly breast exam. I've also asked her to return back in 3 months of that I can examine her breasts again. Her mammogram is due in September which will be a three-dimensional mammogram. Her PCP has been doing her blood work. No Pap smear was done today in accordance to the new guidelines. She is now 2 years of age and I have given her the name of my colleagues for her to see a gastroenterologist for screening colonoscopy. I have provided her with Hemoccult card for testing to submit to the office for testing at a later date. We discussed importance of calcium and vitamin D for osteoporosis prevention. Literature information on the perimenopausal hormone replacement therapy was provided.  Note: This dictation was prepared with  Dragon/digital dictation along withSmart phrase technology. Any transcriptional errors that result from this process are unintentional.   Terrance Mass MD, 10:32 AM 05/21/2014

## 2014-06-12 ENCOUNTER — Encounter: Payer: Self-pay | Admitting: Gastroenterology

## 2014-07-10 ENCOUNTER — Other Ambulatory Visit: Payer: 59 | Admitting: Anesthesiology

## 2014-07-10 DIAGNOSIS — Z1211 Encounter for screening for malignant neoplasm of colon: Secondary | ICD-10-CM

## 2014-08-11 ENCOUNTER — Ambulatory Visit (AMBULATORY_SURGERY_CENTER): Payer: Self-pay | Admitting: *Deleted

## 2014-08-11 VITALS — Ht 65.0 in | Wt 133.6 lb

## 2014-08-11 DIAGNOSIS — Z1211 Encounter for screening for malignant neoplasm of colon: Secondary | ICD-10-CM

## 2014-08-11 MED ORDER — NA SULFATE-K SULFATE-MG SULF 17.5-3.13-1.6 GM/177ML PO SOLN: 1.0000 | Freq: Once | ORAL | Status: DC

## 2014-08-11 NOTE — Progress Notes (Signed)
No home 02 use. ewm No egg or soy allergy. ewm No diet pills. ewm No problems with past sedation. ewm emmi video to pt's e mail. ewm

## 2014-08-19 ENCOUNTER — Ambulatory Visit (INDEPENDENT_AMBULATORY_CARE_PROVIDER_SITE_OTHER): Payer: 59 | Admitting: Gynecology

## 2014-08-19 ENCOUNTER — Other Ambulatory Visit: Payer: Self-pay

## 2014-08-19 ENCOUNTER — Encounter: Payer: Self-pay | Admitting: Gynecology

## 2014-08-19 VITALS — BP 130/80

## 2014-08-19 DIAGNOSIS — Z1231 Encounter for screening mammogram for malignant neoplasm of breast: Secondary | ICD-10-CM

## 2014-08-19 DIAGNOSIS — N644 Mastodynia: Secondary | ICD-10-CM

## 2014-08-19 NOTE — Progress Notes (Signed)
   48 year old who presented to the office for followup breast exam. She was seen in the office on July 16 and had been concerned at that time of some tenderness on the left outer quadrant of her breasts. She had thought that she felt a nodule but by the time she came to the office visit it had gotten smaller and or resolve. She had denied any nipple discharge. Her mammogram in November 2014 was normal. She does have dense breast and had a 3-dimensional mammogram. She has had history of cysts on both breasts in the past. She was asymptomatic today.  Exam: Both breasts are examined in the sitting and supine position. Both breasts are symmetrical in appearance. The skin discoloration or nipple inversion no palpable masses or tenderness no supraclavicular or axillary lymphadenopathy.   Assessment/plan: The patient was reassured on findings on breast exam today. She was encouraged to continue to do her monthly breast exam. She was scheduled her mammogram in 2 weeks initial request also a 3-dimensional mammogram. She is also scheduled the next 2 weeks for her screening colonoscopy. The patient is on no hormones.

## 2014-08-25 ENCOUNTER — Ambulatory Visit (AMBULATORY_SURGERY_CENTER): Payer: 59 | Admitting: Gastroenterology

## 2014-08-25 ENCOUNTER — Encounter: Payer: Self-pay | Admitting: Gastroenterology

## 2014-08-25 VITALS — BP 130/70 | HR 44 | Temp 96.3°F | Resp 8 | Ht 65.0 in | Wt 133.0 lb

## 2014-08-25 DIAGNOSIS — K648 Other hemorrhoids: Secondary | ICD-10-CM

## 2014-08-25 DIAGNOSIS — Z1211 Encounter for screening for malignant neoplasm of colon: Secondary | ICD-10-CM

## 2014-08-25 MED ORDER — SODIUM CHLORIDE 0.9 % IV SOLN: 500.0000 mL | INTRAVENOUS | Status: DC

## 2014-08-25 NOTE — Op Note (Signed)
[facility]  [address] [place], [id number]   COLONOSCOPY PROCEDURE REPORT  PATIENT: Sheila Bentley, Sheila Bentley  MR#: [id number] BIRTHDATE: 06/28/1966 , 48  yrs. old GENDER: female ENDOSCOPIST: Inda Castle, MD REFERRED BY: PROCEDURE DATE:  08/25/2014 PROCEDURE: First Screening Colonoscopy - Avg.  risk and is 50 yrs.  old or older Yes.  Prior Negative Screening - Now for repeat screening. N/A  History of Adenoma - Now for follow-up colonoscopy & has been > or = to 3 yrs.  N/A  Polyps Removed Today? No.  Recommend repeat exam, <10 yrs? No. ASA CLASS:   Class II INDICATIONS:average risk for colon cancer. MEDICATIONS: Monitored anesthesia care and Propofol 200 mg IV  DESCRIPTION OF PROCEDURE:   After the risks benefits and alternatives of the procedure were thoroughly explained, informed consent was obtained.  The digital rectal exam revealed no abnormalities of the rectum.   The [id number]  endoscope was introduced through the anus and advanced to the cecum, which was identified by both the appendix and ileocecal valve. No adverse events experienced.   The quality of the prep was excellent using Suprep  The instrument was then slowly withdrawn as the colon was fully examined.      COLON FINDINGS: Internal hemorrhoids were found.  Retroflexed views revealed no abnormalities. The time to cecum=7 minutes 17 seconds. Withdrawal time=6 minutes 03 seconds.  The scope was withdrawn and the procedure completed. COMPLICATIONS: There were no immediate complications.  ENDOSCOPIC IMPRESSION: Internal hemorrhoids  RECOMMENDATIONS: Continue current colorectal screening recommendations for "routine risk" patients with a repeat colonoscopy in 10 years.  eSigned:  Inda Castle, MD 08/25/2014 11:08 AM   cc: Elie Goody, MD

## 2014-08-25 NOTE — Progress Notes (Signed)
Report to PACU, RN, vss, BBS= Clear.  

## 2014-08-25 NOTE — Patient Instructions (Signed)
Discharge instructions given with verbal understanding. Handouts on hemorrhoids and a high fiber diet. Resume previous medications. YOU HAD AN ENDOSCOPIC PROCEDURE TODAY AT [facility]: Refer to the procedure report that was given to you for any specific questions about what was found during the examination.  If the procedure report does not answer your questions, please call your gastroenterologist to clarify.  If you requested that your care partner not be given the details of your procedure findings, then the procedure report has been included in a sealed envelope for you to review at your convenience later.  YOU SHOULD EXPECT: Some feelings of bloating in the abdomen. Passage of more gas than usual.  Walking can help get rid of the air that was put into your GI tract during the procedure and reduce the bloating. If you had a lower endoscopy (such as a colonoscopy or flexible sigmoidoscopy) you may notice spotting of blood in your stool or on the toilet paper. If you underwent a bowel prep for your procedure, then you may not have a normal bowel movement for a few days.  DIET: Your first meal following the procedure should be a light meal and then it is ok to progress to your normal diet.  A half-sandwich or bowl of soup is an example of a good first meal.  Heavy or fried foods are harder to digest and may make you feel nauseous or bloated.  Likewise meals heavy in dairy and vegetables can cause extra gas to form and this can also increase the bloating.  Drink plenty of fluids but you should avoid alcoholic beverages for 24 hours.  ACTIVITY: Your care partner should take you home directly after the procedure.  You should plan to take it easy, moving slowly for the rest of the day.  You can resume normal activity the day after the procedure however you should NOT DRIVE or use heavy machinery for 24 hours (because of the sedation medicines used during the test).    SYMPTOMS TO REPORT  IMMEDIATELY: A gastroenterologist can be reached at any hour.  During normal business hours, 8:30 AM to 5:00 PM Monday through Friday, call [phone].  After hours and on weekends, please call the GI answering service at [phone] who will take a message and have the physician on call contact you.   Following lower endoscopy (colonoscopy or flexible sigmoidoscopy):  Excessive amounts of blood in the stool  Significant tenderness or worsening of abdominal pains  Swelling of the abdomen that is new, acute  Fever of 100F or higher  FOLLOW UP: If any biopsies were taken you will be contacted by phone or by letter within the next 1-3 weeks.  Call your gastroenterologist if you have not heard about the biopsies in 3 weeks.  Our staff will call the home number listed on your records the next business day following your procedure to check on you and address any questions or concerns that you may have at that time regarding the information given to you following your procedure. This is a courtesy call and so if there is no answer at the home number and we have not heard from you through the emergency physician on call, we will assume that you have returned to your regular daily activities without incident.  SIGNATURES/CONFIDENTIALITY: You and/or your care partner have signed paperwork which will be entered into your electronic medical record.  These signatures attest to the fact that that the information above on your  After Visit Summary has been reviewed and is understood.  Full responsibility of the confidentiality of this discharge information lies with you and/or your care-partner.

## 2014-08-26 ENCOUNTER — Telehealth: Payer: Self-pay | Admitting: *Deleted

## 2014-08-26 NOTE — Telephone Encounter (Signed)
  Follow up Call-  Call back number 08/25/2014  Post procedure Call Back phone  # [phone]  Permission to leave phone message Yes     Patient questions:  Do you have a fever, pain , or abdominal swelling? No. Pain Score  0 *  Have you tolerated food without any problems? Yes.    Have you been able to return to your normal activities yes  Do you have any questions about your discharge instructions: Diet   No. Medications  No. Follow up visit  No.  Do you have questions or concerns about your Care? No.  Actions: * If pain score is 4 or above: No action needed, pain <4.

## 2014-09-07 ENCOUNTER — Encounter: Payer: Self-pay | Admitting: Gastroenterology

## 2014-09-22 ENCOUNTER — Ambulatory Visit
Admission: RE | Admit: 2014-09-22 | Discharge: 2014-09-22 | Disposition: A | Discharge status: Home / Self Care | Payer: 59 | Source: Ambulatory Visit

## 2014-09-22 DIAGNOSIS — Z1231 Encounter for screening mammogram for malignant neoplasm of breast: Secondary | ICD-10-CM

## 2015-05-24 ENCOUNTER — Encounter: Payer: Self-pay | Admitting: Gynecology

## 2015-05-24 ENCOUNTER — Ambulatory Visit (INDEPENDENT_AMBULATORY_CARE_PROVIDER_SITE_OTHER): Payer: 59 | Admitting: Gynecology

## 2015-05-24 VITALS — BP 122/76 | Ht 64.5 in | Wt 132.8 lb

## 2015-05-24 DIAGNOSIS — Z01419 Encounter for gynecological examination (general) (routine) without abnormal findings: Secondary | ICD-10-CM

## 2015-05-24 NOTE — Progress Notes (Signed)
LASHEKA KEMPNER 05/26/1966 [id number]   History:    49 y.o.  for annual gyn exam with no complaints today. Patient was seen for the first time as a new patient to the practice in 2014. Patient was a former patient of Dr. Ubaldo Glassing who in 2013 today transvaginal hysterectomy as a result of menorrhagia and anemia. She has done well since her surgery. She continues on iron supplementation. Review of her records indicated that her primary physician is in [place], [place]. who has been doing her blood work.Review of her records indicated that in 2012 for bilateral breast nodularity since she had a mammogram and ultrasound which the findings demonstrated bilateral simple cysts. In 2013 and 2014 her mammogram was described as normal. Patient with no prior history of abnormal Pap smears. Patient with very mild and infrequent hot flashes. Patient reports normal colonoscopy last year.  Past medical history,surgical history, family history and social history were all reviewed and documented in the EPIC chart.  Gynecologic History Patient's last menstrual period was 07/21/2012. Contraception: status post hysterectomy Last Pap: 2013. Results were: normal Last mammogram: 2015. Results were: normal  Obstetric History OB History  Gravida Para Term Preterm AB SAB TAB Ectopic Multiple Living  2 1   1 1    1     # Outcome Date GA Lbr Len/2nd Weight Sex Delivery Anes PTL Lv  2 SAB           1 Para                ROS: A ROS was performed and pertinent positives and negatives are included in the history.  GENERAL: No fevers or chills. HEENT: No change in vision, no earache, sore throat or sinus congestion. NECK: No pain or stiffness. CARDIOVASCULAR: No chest pain or pressure. No palpitations. PULMONARY: No shortness of breath, cough or wheeze. GASTROINTESTINAL: No abdominal pain, nausea, vomiting or diarrhea, melena or bright red blood per rectum. GENITOURINARY: No urinary frequency, urgency, hesitancy or dysuria.  MUSCULOSKELETAL: No joint or muscle pain, no back pain, no recent trauma. DERMATOLOGIC: No rash, no itching, no lesions. ENDOCRINE: No polyuria, polydipsia, no heat or cold intolerance. No recent change in weight. HEMATOLOGICAL: No anemia or easy bruising or bleeding. NEUROLOGIC: No headache, seizures, numbness, tingling or weakness. PSYCHIATRIC: No depression, no loss of interest in normal activity or change in sleep pattern.     Exam: chaperone present  BP 122/76 mmHg  Ht 5' 4.5" (1.638 m)  Wt 132 lb 12.8 oz (60.238 kg)  BMI 22.45 kg/m2  LMP 07/21/2012  Body mass index is 22.45 kg/(m^2).  General appearance : Well developed well nourished female. No acute distress HEENT: Eyes: no retinal hemorrhage or exudates,  Neck supple, trachea midline, no carotid bruits, no thyroidmegaly Lungs: Clear to auscultation, no rhonchi or wheezes, or rib retractions  Heart: Regular rate and rhythm, no murmurs or gallops Breast:Examined in sitting and supine position were symmetrical in appearance, no palpable masses or tenderness,  no skin retraction, no nipple inversion, no nipple discharge, no skin discoloration, no axillary or supraclavicular lymphadenopathy Abdomen: no palpable masses or tenderness, no rebound or guarding Extremities: no edema or skin discoloration or tenderness  Pelvic:  Bartholin, Urethra, Skene Glands: Within normal limits             Vagina: No gross lesions or discharge  Cervix: Absent  Uterus  absent  Adnexa  Without masses or tenderness  Anus and perineum  normal   Rectovaginal  normal sphincter tone without palpated masses or tenderness             Hemoccult colonoscopy less than 12 months ago normal     Assessment/Plan:  49 y.o. female for annual exam perimenopausal doing weight. PCP has been doing her blood work. Pap smear not indicated according to the new guidelines. Next year we will need to do a bone density study. We discussed importance of monthly breast exam.  Patient schedule mammogram at the end of this year. We'll get a baseline bone density study next year.   Terrance Mass MD, 9:37 AM 05/24/2015

## 2015-05-24 NOTE — Patient Instructions (Signed)
Bone Densitometry Bone densitometry is a special X-ray that measures your bone density and can be used to help predict your risk of bone fractures. This test is used to determine bone mineral content and density to diagnose osteoporosis. Osteoporosis is the loss of bone that may cause the bone to become weak. Osteoporosis commonly occurs in women entering menopause. However, it may be found in men and in people with other diseases. PREPARATION FOR TEST No preparation necessary. WHO SHOULD BE TESTED?  All women older than 21.  Postmenopausal women (50 to 78) with risk factors for osteoporosis.  People with a previous fracture caused by normal activities.  People with a small body frame (less than 127 poundsor a body mass index [BMI] of less than 21).  People who have a parent with a hip fracture or history of osteoporosis.  People who smoke.  People who have rheumatoid arthritis.  Anyone who engages in excessive alcohol use (more than 3 drinks most days).  Women who experience early menopause. WHEN SHOULD YOU BE RETESTED? Current guidelines suggest that you should wait at least 2 years before doing a bone density test again if your first test was normal.Recent studies indicated that women with normal bone density may be able to wait a few years before needing to repeat a bone density test. You should discuss this with your caregiver.  NORMAL FINDINGS   Normal: less than standard deviation below normal (greater than -1).  Osteopenia: 1 to 2.5 standard deviations below normal (-1 to -2.5).  Osteoporosis: greater than 2.5 standard deviations below normal (less than -2.5). Test results are reported as a "T score" and a "Z score."The T score is a number that compares your bone density with the bone density of healthy, young women.The Z score is a number that compares your bone density with the scores of women who are the same age, gender, and race.  Ranges for normal findings may vary  among different laboratories and hospitals. You should always check with your doctor after having lab work or other tests done to discuss the meaning of your test results and whether your values are considered within normal limits. MEANING OF TEST  Your caregiver will go over the test results with you and discuss the importance and meaning of your results, as well as treatment options and the need for additional tests if necessary. OBTAINING THE TEST RESULTS It is your responsibility to obtain your test results. Ask the lab or department performing the test when and how you will get your results. Document Released: 11/14/2004 Document Revised: 01/15/2012 Document Reviewed: 12/07/2010 [facility] Patient Information 2015 [place], [place]. This information is not intended to replace advice given to you by your health care provider. Make sure you discuss any questions you have with your health care provider. Perimenopause Perimenopause is the time when your body begins to move into the menopause (no menstrual period for 12 straight months). It is a natural process. Perimenopause can begin 2-8 years before the menopause and usually lasts for 1 year after the menopause. During this time, your ovaries may or may not produce an egg. The ovaries vary in their production of estrogen and progesterone hormones each month. This can cause irregular menstrual periods, difficulty getting pregnant, vaginal bleeding between periods, and uncomfortable symptoms. CAUSES  Irregular production of the ovarian hormones, estrogen and progesterone, and not ovulating every month.  Other causes include:  Tumor of the pituitary gland in the brain.  Medical disease that affects the ovaries.  Radiation treatment.  Chemotherapy.  Unknown causes.  Heavy smoking and excessive alcohol intake can bring on perimenopause sooner. SIGNS AND SYMPTOMS   Hot flashes.  Night sweats.  Irregular menstrual periods.  Decreased sex  drive.  Vaginal dryness.  Headaches.  Mood swings.  Depression.  Memory problems.  Irritability.  Tiredness.  Weight gain.  Trouble getting pregnant.  The beginning of losing bone cells (osteoporosis).  The beginning of hardening of the arteries (atherosclerosis). DIAGNOSIS  Your health care provider will make a diagnosis by analyzing your age, menstrual history, and symptoms. He or she will do a physical exam and note any changes in your body, especially your female organs. Female hormone tests may or may not be helpful depending on the amount of female hormones you produce and when you produce them. However, other hormone tests may be helpful to rule out other problems. TREATMENT  In some cases, no treatment is needed. The decision on whether treatment is necessary during the perimenopause should be made by you and your health care provider based on how the symptoms are affecting you and your lifestyle. Various treatments are available, such as:  Treating individual symptoms with a specific medicine for that symptom.  Herbal medicines that can help specific symptoms.  Counseling.  Group therapy. HOME CARE INSTRUCTIONS   Keep track of your menstrual periods (when they occur, how heavy they are, how long between periods, and how long they last) as well as your symptoms and when they started.  Only take over-the-counter or prescription medicines as directed by your health care provider.  Sleep and rest.  Exercise.  Eat a diet that contains calcium (good for your bones) and soy (acts like the estrogen hormone).  Do not smoke.  Avoid alcoholic beverages.  Take vitamin supplements as recommended by your health care provider. Taking vitamin E may help in certain cases.  Take calcium and vitamin D supplements to help prevent bone loss.  Group therapy is sometimes helpful.  Acupuncture may help in some cases. SEEK MEDICAL CARE IF:   You have questions about any  symptoms you are having.  You need a referral to a specialist (gynecologist, psychiatrist, or psychologist). SEEK IMMEDIATE MEDICAL CARE IF:   You have vaginal bleeding.  Your period lasts longer than 8 days.  Your periods are recurring sooner than 21 days.  You have bleeding after intercourse.  You have severe depression.  You have pain when you urinate.  You have severe headaches.  You have vision problems. Document Released: 11/30/2004 Document Revised: 08/13/2013 Document Reviewed: 05/22/2013 [facility] Patient Information 2015 [place], [place]. This information is not intended to replace advice given to you by your health care provider. Make sure you discuss any questions you have with your health care provider.

## 2015-08-18 ENCOUNTER — Other Ambulatory Visit: Payer: Self-pay

## 2015-08-18 DIAGNOSIS — Z1231 Encounter for screening mammogram for malignant neoplasm of breast: Secondary | ICD-10-CM

## 2015-09-24 ENCOUNTER — Ambulatory Visit
Admission: RE | Admit: 2015-09-24 | Discharge: 2015-09-24 | Disposition: A | Discharge status: Home / Self Care | Payer: 59 | Source: Ambulatory Visit

## 2015-09-24 DIAGNOSIS — Z1231 Encounter for screening mammogram for malignant neoplasm of breast: Secondary | ICD-10-CM

## 2016-05-24 ENCOUNTER — Ambulatory Visit (INDEPENDENT_AMBULATORY_CARE_PROVIDER_SITE_OTHER): Payer: 59 | Admitting: Gynecology

## 2016-05-24 ENCOUNTER — Encounter: Payer: Self-pay | Admitting: Gynecology

## 2016-05-24 VITALS — BP 126/78 | Ht 63.75 in | Wt 129.0 lb

## 2016-05-24 DIAGNOSIS — R1900 Intra-abdominal and pelvic swelling, mass and lump, unspecified site: Secondary | ICD-10-CM

## 2016-05-24 DIAGNOSIS — Z01419 Encounter for gynecological examination (general) (routine) without abnormal findings: Secondary | ICD-10-CM

## 2016-05-24 DIAGNOSIS — N949 Unspecified condition associated with female genital organs and menstrual cycle: Secondary | ICD-10-CM

## 2016-05-24 LAB — CBC WITH DIFFERENTIAL/PLATELET
BASOS PCT: 1 %
Basophils Absolute: 28 cells/uL (ref 0–200)
EOS PCT: 2 %
Eosinophils Absolute: 56 cells/uL (ref 15–500)
HEMATOCRIT: 35.8 % (ref 35.0–45.0)
HEMOGLOBIN: 11.7 g/dL (ref 11.7–15.5)
LYMPHS ABS: 1288 {cells}/uL (ref 850–3900)
Lymphocytes Relative: 46 %
MCH: 27 pg (ref 27.0–33.0)
MCHC: 32.7 g/dL (ref 32.0–36.0)
MCV: 82.7 fL (ref 80.0–100.0)
MONO ABS: 196 {cells}/uL — AB (ref 200–950)
MPV: 9.4 fL (ref 7.5–12.5)
Monocytes Relative: 7 %
NEUTROS ABS: 1232 {cells}/uL — AB (ref 1500–7800)
Neutrophils Relative %: 44 %
Platelets: 270 10*3/uL (ref 140–400)
RBC: 4.33 MIL/uL (ref 3.80–5.10)
RDW: 14.4 % (ref 11.0–15.0)
WBC: 2.8 10*3/uL — AB (ref 3.8–10.8)

## 2016-05-24 LAB — TSH: TSH: 1.13 mIU/L

## 2016-05-24 NOTE — Progress Notes (Signed)
Sheila Bentley 01-17-1966 [id number]   History:    50 y.o.  for annual gyn exam with no complaints today.Patient was a former patient of Dr. Ubaldo Glassing who in 2013 today transvaginal hysterectomy as a result of menorrhagia and anemia. She has done well since her surgery. She has not had her blood work done this year her PCP was in [facility].Review of her records indicated that in 2012 for bilateral breast nodularity since she had a mammogram and ultrasound which the findings demonstrated bilateral simple cysts. In 2013 and 2014 her mammogram was described as normal. Patient with no prior history of abnormal Pap smears. Patient with very mild and infrequent hot flashes. Patient reports normal colonoscopy in 2016.  Past medical history,surgical history, family history and social history were all reviewed and documented in the EPIC chart.  Gynecologic History Patient's last menstrual period was 07/21/2012. Contraception: status post hysterectomy Last Pap: several years ago. Results were: normal Last mammogram: November 2016. Results were: normal  Obstetric History OB History  Gravida Para Term Preterm AB SAB TAB Ectopic Multiple Living  2 1   1 1    1     # Outcome Date GA Lbr Len/2nd Weight Sex Delivery Anes PTL Lv  2 SAB           1 Para                ROS: A ROS was performed and pertinent positives and negatives are included in the history.  GENERAL: No fevers or chills. HEENT: No change in vision, no earache, sore throat or sinus congestion. NECK: No pain or stiffness. CARDIOVASCULAR: No chest pain or pressure. No palpitations. PULMONARY: No shortness of breath, cough or wheeze. GASTROINTESTINAL: No abdominal pain, nausea, vomiting or diarrhea, melena or bright red blood per rectum. GENITOURINARY: No urinary frequency, urgency, hesitancy or dysuria. MUSCULOSKELETAL: No joint or muscle pain, no back pain, no recent trauma. DERMATOLOGIC: No rash, no itching, no lesions. ENDOCRINE:  No polyuria, polydipsia, no heat or cold intolerance. No recent change in weight. HEMATOLOGICAL: No anemia or easy bruising or bleeding. NEUROLOGIC: No headache, seizures, numbness, tingling or weakness. PSYCHIATRIC: No depression, no loss of interest in normal activity or change in sleep pattern.     Exam: chaperone present  BP 126/78 mmHg  Ht 5' 3.75" (1.619 m)  Wt 129 lb (58.514 kg)  BMI 22.32 kg/m2  LMP 07/21/2012  Body mass index is 22.32 kg/(m^2).  General appearance : Well developed well nourished female. No acute distress HEENT: Eyes: no retinal hemorrhage or exudates,  Neck supple, trachea midline, no carotid bruits, no thyroidmegaly Lungs: Clear to auscultation, no rhonchi or wheezes, or rib retractions  Heart: Regular rate and rhythm, no murmurs or gallops Breast:Examined in sitting and supine position were symmetrical in appearance, no palpable masses or tenderness,  no skin retraction, no nipple inversion, no nipple discharge, no skin discoloration, no axillary or supraclavicular lymphadenopathy Abdomen: no palpable masses or tenderness, no rebound or guarding Extremities: no edema or skin discoloration or tenderness  Pelvic:  Bartholin, Urethra, Skene Glands: Within normal limits             Vagina: No gross lesions or discharge  Cervix:absent  Uterus  Absent,ullness notedbehind the vaginal cuff ovarian cyst? Or segment of small bowel?  Adnexa  Without masses or tenderness  Anus and perineum  normal   Rectovaginal  normal sphincter tone without palpated masses or tenderness  Hemoccult we'll provided next office visit     Assessment/Plan:  50 y.o. female for annual exam we'll have her screening blood work today consisting of comprehensive metabolic panel, fasting lipid profile, TSH, CBC, and urinalysis. She will return back for an ultrasound of the next few weeks to better assess the fullness noted on today's pelvic exam. We discussed importance of calcium  vitamin D and weightbearing exercises for osteoporosis prevention.   Terrance Mass MD, 1:42 PM 05/24/2016

## 2016-05-25 ENCOUNTER — Other Ambulatory Visit: Payer: Self-pay | Admitting: *Deleted

## 2016-05-25 ENCOUNTER — Telehealth: Payer: Self-pay | Admitting: *Deleted

## 2016-05-25 DIAGNOSIS — Z13820 Encounter for screening for osteoporosis: Secondary | ICD-10-CM

## 2016-05-25 LAB — LIPID PANEL
CHOL/HDL RATIO: 2.4 ratio (ref <=5.0)
CHOLESTEROL: 215 mg/dL — AB (ref 125–200)
HDL: 88 mg/dL (ref >=46)
LDL Cholesterol: 116 mg/dL (ref <130)
Triglycerides: 54 mg/dL (ref <150)
VLDL: 11 mg/dL (ref <30)

## 2016-05-25 LAB — URINALYSIS W MICROSCOPIC + REFLEX CULTURE
BILIRUBIN URINE: NEGATIVE
Bacteria, UA: NONE SEEN [HPF]
Casts: NONE SEEN [LPF]
Crystals: NONE SEEN [HPF]
GLUCOSE, UA: NEGATIVE
Hgb urine dipstick: NEGATIVE
Ketones, ur: NEGATIVE
LEUKOCYTES UA: NEGATIVE
NITRITE: NEGATIVE
PH: 5.5 (ref 5.0–8.0)
Protein, ur: NEGATIVE
SPECIFIC GRAVITY, URINE: 1.024 (ref 1.001–1.035)
Squamous Epithelial / LPF: NONE SEEN [HPF] (ref <=5)
WBC UA: NONE SEEN WBC/HPF (ref <=5)
YEAST: NONE SEEN [HPF]

## 2016-05-25 LAB — COMPREHENSIVE METABOLIC PANEL
ALBUMIN: 4.4 g/dL (ref 3.6–5.1)
ALT: 9 U/L (ref 6–29)
AST: 16 U/L (ref 10–35)
Alkaline Phosphatase: 46 U/L (ref 33–130)
BUN: 13 mg/dL (ref 7–25)
CALCIUM: 9 mg/dL (ref 8.6–10.4)
CHLORIDE: 105 mmol/L (ref 98–110)
CO2: 24 mmol/L (ref 20–31)
Creat: 0.77 mg/dL (ref 0.50–1.05)
Glucose, Bld: 84 mg/dL (ref 65–99)
Potassium: 4.1 mmol/L (ref 3.5–5.3)
SODIUM: 140 mmol/L (ref 135–146)
Total Bilirubin: 1.1 mg/dL (ref 0.2–1.2)
Total Protein: 7.2 g/dL (ref 6.1–8.1)

## 2016-05-25 NOTE — Telephone Encounter (Signed)
I had mentioned that next year we will probably be doing one not this year

## 2016-05-25 NOTE — Telephone Encounter (Signed)
Left the below on pt voicemail. 

## 2016-05-25 NOTE — Telephone Encounter (Signed)
Dr.Fernandez please see the below 

## 2016-05-25 NOTE — Telephone Encounter (Signed)
-----   Message from Sinclair Grooms sent at 05/24/2016  4:11 PM EDT ----- Regarding: bone density Patient stated at check out that JF wanted her to have a bone density test. He did not mention it in his notes and there is no order for it.  Can you please ask JF if he wants patient to have bone density?  Thx

## 2016-05-26 LAB — URINE CULTURE: Organism ID, Bacteria: NO GROWTH

## 2016-06-01 ENCOUNTER — Telehealth: Payer: Self-pay | Admitting: *Deleted

## 2016-06-01 DIAGNOSIS — D729 Disorder of white blood cells, unspecified: Secondary | ICD-10-CM

## 2016-06-01 NOTE — Telephone Encounter (Signed)
-----   Message from Ramond Craver, [place] sent at 06/01/2016 11:37 AM EDT ----- Regarding: referral to oncology Per Dr. Moshe Salisbury "Please inform patient that I would like to refer her to the hematologist/oncologist at the [facility] regional [place] for further evaluation of her low white blood count. 3 years ago her white blood count was slightly below normal in this year once again I would like further evaluation."  Patient informed and knows she will be hearing about appointment.  Thanks!!

## 2016-06-01 NOTE — Telephone Encounter (Signed)
Referral placed they will contact pt to schedule. 

## 2016-06-06 ENCOUNTER — Telehealth: Payer: Self-pay | Admitting: Hematology and Oncology

## 2016-06-06 ENCOUNTER — Encounter: Payer: Self-pay | Admitting: Hematology and Oncology

## 2016-06-06 NOTE — Telephone Encounter (Signed)
Appointment with Alvy Bimler on 8/17 at 1030am. Aware to arrive 30 minutes early. Demographics verified. Letter mailed to the patient.

## 2016-06-06 NOTE — Telephone Encounter (Signed)
Appointment scheduled on 06/22/16.

## 2016-06-12 ENCOUNTER — Ambulatory Visit (INDEPENDENT_AMBULATORY_CARE_PROVIDER_SITE_OTHER): Payer: 59

## 2016-06-12 ENCOUNTER — Ambulatory Visit (INDEPENDENT_AMBULATORY_CARE_PROVIDER_SITE_OTHER): Payer: 59 | Admitting: Gynecology

## 2016-06-12 ENCOUNTER — Encounter: Payer: Self-pay | Admitting: Gynecology

## 2016-06-12 VITALS — BP 122/78

## 2016-06-12 DIAGNOSIS — N949 Unspecified condition associated with female genital organs and menstrual cycle: Secondary | ICD-10-CM | POA: Diagnosis not present

## 2016-06-12 DIAGNOSIS — R1900 Intra-abdominal and pelvic swelling, mass and lump, unspecified site: Secondary | ICD-10-CM

## 2016-06-12 DIAGNOSIS — D72819 Decreased white blood cell count, unspecified: Secondary | ICD-10-CM

## 2016-06-12 NOTE — Progress Notes (Signed)
   Patient is a 50 year old was seen in the office for her annual exam on July 19 of this year prior to that it was in 2016. At time of her exam there was a questionable fullness noted near the vaginal cuff and she was asked to return today for an ultrasound.Patient was a former patient of Dr. Ubaldo Glassing who in 2013 today transvaginal hysterectomy as a result of menorrhagia and anemia. She has done well since her surgery. She was otherwise asymptomatic today. Review of her record indicated that her white blood count in 2013 was 2.9 and this year was 2.8 she's been referred to the hematologist oncologist for which she has an appointment the 17th of this month.  Pelvic ultrasound: Absent uterus. Right ovary normal. Left ovary normal. No fluid in the cul-de-sac. No adnexal masses. Vaginal cuff was normal otherwise.  Assessment/plan: 50 year old menopausal patient on no hormone replacement therapy at time of her annual exam last month it was a questionable fullness at the area the vaginal cuff and there was a segment of bowel otherwise normal ultrasound. Because of patient's leukopenia she was referred to the hematologist oncologist which she has an appointment later this month.  Greater than 90% of the time was spent counseling correlating care for this patient. Total time of consultation 10 minutes

## 2016-06-12 NOTE — Patient Instructions (Signed)
Leukopenia Leukopenia is a condition in which you have a low number of white blood cells. White blood cells help your body fight infections. The number of white blood cells in the body varies from person to person. Leukopenia is usually defined as having fewer than 4,000 white blood cells in 1 microliter of blood. There are five types of white blood cells. Two types make up most of your white blood cell count. These are neutrophils and lymphocytes. When your level of neutrophils is low, it is called neutropenia. When your lymphocytes are low, it is called lymphocytopenia. Neutropenia is the most dangerous type of leukopenia because it can lead to dangerous infections. CAUSES  Most white blood cells are made in the soft tissue inside your bones (bone marrow). Conditions that damage or suppress bone marrow are the most common causes of leukopenia. These include:  Medicine or X-ray treatments for cancer.  Serious infections.  Cancer of the white blood cells (leukemia or myeloma).  Medicines, including antibiotics, cardiac drugs, steroids, and those used to treat rheumatoid arthritis. Leukopenia also happens when white blood cells are destroyed after leaving your bone marrow. Causes may include:  Liver disease.  Diseases of the immune system (autoimmune disease).  Vitamin B deficiencies. SIGNS AND SYMPTOMS One of the most common signs of leukopenia, especially severe neutropenia, is having a lot of bacterial infections. Different infections have different symptoms. An infection in your lungs may cause coughing. A urinary tract infection may cause frequent urination and a burning sensation. You may also get infections of the blood, skin, rectum, throat, sinus, or ear. General signs and symptoms of leukopenia include:  Fever.  Fatigue.  Swollen glands (lymph nodes).  Painful mouth ulcers.  Gum disease. DIAGNOSIS  Your health care provider can diagnose leukopenia based on a physical exam  and the results of lab tests. During a physical exam, your health care provider will feel for swollen lymph nodes and check whether your spleen is enlarged. Your spleen is an organ on the left side of your body that stores white blood cells. Tests that may be done include:  A complete blood count. This blood test counts each type of white cell.  Bone marrow aspiration. Some bone marrow is removed to be checked under a microscope.  Lymph node biopsy. Some lymph node tissue is removed to be checked under a microscope.  Other types of blood tests or imaging tests. TREATMENT  Treatment of leukopenia depends on the cause. Some common treatments include:  Antibiotics for bacterial infections.  No longer taking medicines that may cause leukopenia.  Vitamin B supplements.  Medicines to stimulate neutrophil production (hematopoietic growth factors) for neutropenia. HOME CARE INSTRUCTIONS  Preventing infection is important if you have leukopenia.  Avoid sick friends and family members.  Wash your hands often.  Do noteat uncooked or undercooked meats.  Wash fruits and vegetables.  Do not eat or drink unpasteurized dairy products.  Get regular dental care, and maintain good dental hygiene.  Keep all follow-up appointments. SEEK MEDICAL CARE IF:  You have chills or a fever.  You have signs or symptoms of infection. SEEK IMMEDIATE MEDICAL CARE IF:  You have a fever or persistent symptoms for more than 2-3 days.  You have trouble breathing.  You have chest pain. MAKE SURE YOU:  Understand these instructions.  Will watch your condition.  Will get help right away if you are not doing well or get worse.   This information is not intended to replace  advice given to you by your health care provider. Make sure you discuss any questions you have with your health care provider.   Document Released: 10/28/2013 Document Reviewed: 10/28/2013 Elsevier Interactive Patient Education  [employer].

## 2016-06-22 ENCOUNTER — Encounter: Payer: Self-pay | Admitting: Hematology and Oncology

## 2016-06-22 ENCOUNTER — Ambulatory Visit (HOSPITAL_BASED_OUTPATIENT_CLINIC_OR_DEPARTMENT_OTHER): Payer: 59 | Admitting: Hematology and Oncology

## 2016-06-22 DIAGNOSIS — D72818 Other decreased white blood cell count: Secondary | ICD-10-CM | POA: Diagnosis not present

## 2016-06-22 DIAGNOSIS — D700 Congenital agranulocytosis: Secondary | ICD-10-CM

## 2016-06-22 NOTE — Progress Notes (Signed)
[facility] NOTE  Patient Care Team: Nicholos Johns, MD as PCP - General (Internal Medicine)  CHIEF COMPLAINTS/PURPOSE OF CONSULTATION:  Chronic leukopenia  HISTORY OF PRESENTING ILLNESS:  Sheila Bentley 50 y.o. female is here because of chronic leukopenia  She was found to have abnormal CBC from recent routine blood work monitoring. According to electronic records, she have chronic leukopenia for long time. Her blood work from October 2013 show white blood cell count of 2.9. She denies recent infection. The last prescription antibiotics was more than 3 months ago There is not reported symptoms of sinus congestion, cough, urinary frequency/urgency or dysuria, diarrhea, joint swelling/pain or abnormal skin rash.  She had no prior history or diagnosis of cancer. Her age appropriate screening programs are up-to-date. The patient has no prior diagnosis of autoimmune disease and was not prescribed corticosteroids related products. She has history of anemia from uterine fibroids, status post hysterectomy. She had history of anemia but that has resolved  MEDICAL HISTORY:  Past Medical History:  Diagnosis Date  . Anemia   . Fibroid, uterine     SURGICAL HISTORY: Past Surgical History:  Procedure Laterality Date  . COLONOSCOPY    . DILATION AND CURETTAGE OF UTERUS  1993  . EXCISION BENIGN CYST LEFT BREAST  2002  (APPROX)  . VAGINAL HYSTERECTOMY  08/26/2012   Procedure: HYSTERECTOMY VAGINAL;  Surgeon: Selinda Orion, MD;  Location: [facility];  Service: Gynecology;  Laterality: Bilateral;  Possible Bilateral Salpingo Oophorectomy     SOCIAL HISTORY: Social History   Social History  . Marital status: Married    Spouse name: N/A  . Number of children: N/A  . Years of education: N/A   Occupational History  . Not on file.   Social History Main Topics  . Smoking status: Never Smoker  . Smokeless tobacco: Never Used  . Alcohol use No  . Drug use:  No  . Sexual activity: Yes    Birth control/ protection: Surgical   Other Topics Concern  . Not on file   Social History Narrative  . No narrative on file    FAMILY HISTORY: Family History  Problem Relation Age of Onset  . Thyroid disease Paternal Aunt   . Colon cancer Neg Hx     ALLERGIES:  has No Known Allergies.  MEDICATIONS:  Current Outpatient Prescriptions  Medication Sig Dispense Refill  . aspirin 81 MG tablet Take 81 mg by mouth daily.    . Calcium-Magnesium-Vitamin D (CALCIUM 500 PO) Take 500 mg by mouth daily.    . Cyanocobalamin (VITAMIN B 12 PO) Take by mouth.    . vitamin E 600 UNIT capsule Take 600 Units by mouth daily.     No current facility-administered medications for this visit.     REVIEW OF SYSTEMS:   Constitutional: Denies fevers, chills or abnormal night sweats Eyes: Denies blurriness of vision, double vision or watery eyes Ears, nose, mouth, throat, and face: Denies mucositis or sore throat Respiratory: Denies cough, dyspnea or wheezes Cardiovascular: Denies palpitation, chest discomfort or lower extremity swelling Gastrointestinal:  Denies nausea, heartburn or change in bowel habits Skin: Denies abnormal skin rashes Lymphatics: Denies new lymphadenopathy or easy bruising Neurological:Denies numbness, tingling or new weaknesses Behavioral/Psych: Mood is stable, no new changes  All other systems were reviewed with the patient and are negative.  PHYSICAL EXAMINATION: ECOG PERFORMANCE STATUS: 0 - Asymptomatic  Vitals:   06/22/16 1023  BP: 131/75  Pulse: 62  Resp: 18  Temp: 98 F (36.7 C)   Filed Weights   06/22/16 1023  Weight: 127 lb 11.2 oz (57.9 kg)    GENERAL:alert, no distress and comfortable SKIN: skin color, texture, turgor are normal, no rashes or significant lesions EYES: normal, conjunctiva are pink and non-injected, sclera clear OROPHARYNX:no exudate, no erythema and lips, buccal mucosa, and tongue normal  NECK: supple,  thyroid normal size, non-tender, without nodularity LYMPH:  no palpable lymphadenopathy in the cervical, axillary or inguinal LUNGS: clear to auscultation and percussion with normal breathing effort HEART: regular rate & rhythm and no murmurs and no lower extremity edema ABDOMEN:abdomen soft, non-tender and normal bowel sounds Musculoskeletal:no cyanosis of digits and no clubbing  PSYCH: alert & oriented x 3 with fluent speech NEURO: no focal motor/sensory deficits  LABORATORY DATA:  I have reviewed the data as listed Recent Results (from the past 2160 hour(s))  CBC with Differential/Platelet     Status: Abnormal   Collection Time: 05/24/16 10:31 AM  Result Value Ref Range   WBC 2.8 (L) 3.8 - 10.8 K/uL   RBC 4.33 3.80 - 5.10 MIL/uL   Hemoglobin 11.7 11.7 - 15.5 g/dL   HCT 35.8 35.0 - 45.0 %   MCV 82.7 80.0 - 100.0 fL   MCH 27.0 27.0 - 33.0 pg   MCHC 32.7 32.0 - 36.0 g/dL   RDW 14.4 11.0 - 15.0 %   Platelets 270 140 - 400 K/uL   MPV 9.4 7.5 - 12.5 fL   Neutro Abs 1,232 (L) 1,500 - 7,800 cells/uL   Lymphs Abs 1,288 850 - 3,900 cells/uL   Monocytes Absolute 196 (L) 200 - 950 cells/uL   Eosinophils Absolute 56 15 - 500 cells/uL   Basophils Absolute 28 0 - 200 cells/uL   Neutrophils Relative % 44 %   Lymphocytes Relative 46 %   Monocytes Relative 7 %   Eosinophils Relative 2 %   Basophils Relative 1 %   Smear Review Criteria for review not met     Comment: ** Please note change in unit of measure and reference range(s). **  Comprehensive metabolic panel     Status: None   Collection Time: 05/24/16 10:31 AM  Result Value Ref Range   Sodium 140 135 - 146 mmol/L   Potassium 4.1 3.5 - 5.3 mmol/L   Chloride 105 98 - 110 mmol/L   CO2 24 20 - 31 mmol/L   Glucose, Bld 84 65 - 99 mg/dL   BUN 13 7 - 25 mg/dL   Creat 0.77 0.50 - 1.05 mg/dL    Comment:   For patients > or = 52 years of age: The upper reference limit for Creatinine is approximately 13% higher for people identified  as African-American.      Total Bilirubin 1.1 0.2 - 1.2 mg/dL   Alkaline Phosphatase 46 33 - 130 U/L   AST 16 10 - 35 U/L   ALT 9 6 - 29 U/L   Total Protein 7.2 6.1 - 8.1 g/dL   Albumin 4.4 3.6 - 5.1 g/dL   Calcium 9.0 8.6 - 10.4 mg/dL  Lipid panel     Status: Abnormal   Collection Time: 05/24/16 10:31 AM  Result Value Ref Range   Cholesterol 215 (H) 125 - 200 mg/dL   Triglycerides 54 <150 mg/dL   HDL 88 >=46 mg/dL   Total CHOL/HDL Ratio 2.4 <=5.0 Ratio   VLDL 11 <30 mg/dL   LDL Cholesterol 116 <130 mg/dL  Comment:   Total Cholesterol/HDL Ratio:CHD Risk                        Coronary Heart Disease Risk Table                                        Men       Women          1/2 Average Risk              3.4        3.3              Average Risk              5.0        4.4           2X Average Risk              9.6        7.1           3X Average Risk             23.4       11.0 Use the calculated Patient Ratio above and the CHD Risk table  to determine the patient's CHD Risk.   TSH     Status: None   Collection Time: 05/24/16 10:31 AM  Result Value Ref Range   TSH 1.13 mIU/L    Comment:   Reference Range   > or = 20 Years  0.40-4.50   Pregnancy Range First trimester  0.26-2.66 Second trimester 0.55-2.73 Third trimester  0.43-2.91     Urinalysis w microscopic + reflex cultur     Status: None   Collection Time: 05/24/16 10:32 AM  Result Value Ref Range   Color, Urine DARK YELLOW YELLOW    Comment: ** Please note change in unit of measure and reference range(s). **      APPearance CLEAR CLEAR   Specific Gravity, Urine 1.024 1.001 - 1.035   pH 5.5 5.0 - 8.0   Glucose, UA NEGATIVE NEGATIVE   Bilirubin Urine NEGATIVE NEGATIVE   Ketones, ur NEGATIVE NEGATIVE   Hgb urine dipstick NEGATIVE NEGATIVE   Protein, ur NEGATIVE NEGATIVE   Nitrite NEGATIVE NEGATIVE   Leukocytes, UA NEGATIVE NEGATIVE   WBC, UA NONE SEEN <=5 WBC/HPF   RBC / HPF 0-2 <=2 RBC/HPF   Squamous  Epithelial / LPF NONE SEEN <=5 HPF   Bacteria, UA NONE SEEN NONE SEEN HPF   Crystals NONE SEEN NONE SEEN HPF   Casts NONE SEEN NONE SEEN LPF   Yeast NONE SEEN NONE SEEN HPF  Urine culture     Status: None   Collection Time: 05/24/16 10:32 AM  Result Value Ref Range   Organism ID, Bacteria NO GROWTH     RADIOGRAPHIC STUDIES: I have personally reviewed the radiological images as listed and agreed with the findings in the report. US Transvaginal Non-ob  Result Date: 06/12/2016 Pelvic ultrasound: Absent uterus. Right ovary normal. Left ovary normal. No fluid in the cul-de-sac. No adnexal masses. Vaginal cuff was normal otherwise   ASSESSMENT & PLAN  Congenital leukopenia The patient have chronic leukopenia since 2013. She is not symptomatic.  She is taking chronic vitamin B 12 supplement. That would have excluded vitamin B 12 deficiency I suspect she may has congenital leukopenia due to  her African-American heritage. We discussed potential workup including additional blood work for other nutritional deficiency, screening for autoimmune disorder and viral infection. Ultimately, the patient has made informed decision not to pursue additional workup. We discussed genetic inheritance. It is likely that her daughter may have congenital leukopenia as well I warned her potential bone marrow suppression during times of extreme stress or infection that might make her white count dropped even lower. I have not made a return appointment for the patient to come back    No orders of the defined types were placed in this encounter.   All questions were answered. The patient knows to call the clinic with any problems, questions or concerns. I spent 30 minutes counseling the patient face to face. The total time spent in the appointment was 40 minutes and more than 50% was on counseling.     [place], [place], MD 06/22/2016 11:05 AM

## 2016-06-22 NOTE — Assessment & Plan Note (Signed)
The patient have chronic leukopenia since 2013. She is not symptomatic.  She is taking chronic vitamin B 12 supplement. That would have excluded vitamin B 12 deficiency I suspect she may has congenital leukopenia due to her African-American heritage. We discussed potential workup including additional blood work for other nutritional deficiency, screening for autoimmune disorder and viral infection. Ultimately, the patient has made informed decision not to pursue additional workup. We discussed genetic inheritance. It is likely that her daughter may have congenital leukopenia as well I warned her potential bone marrow suppression during times of extreme stress or infection that might make her white count dropped even lower. I have not made a return appointment for the patient to come back

## 2016-08-21 ENCOUNTER — Other Ambulatory Visit: Payer: Self-pay | Admitting: Gynecology

## 2016-08-21 DIAGNOSIS — Z1231 Encounter for screening mammogram for malignant neoplasm of breast: Secondary | ICD-10-CM

## 2016-10-02 ENCOUNTER — Ambulatory Visit
Admission: RE | Admit: 2016-10-02 | Discharge: 2016-10-02 | Disposition: A | Discharge status: Home / Self Care | Payer: 59 | Source: Ambulatory Visit | Attending: Gynecology | Admitting: Gynecology

## 2016-10-02 DIAGNOSIS — Z1231 Encounter for screening mammogram for malignant neoplasm of breast: Secondary | ICD-10-CM

## 2016-10-03 ENCOUNTER — Other Ambulatory Visit: Payer: Self-pay | Admitting: Gynecology

## 2016-10-03 DIAGNOSIS — R928 Other abnormal and inconclusive findings on diagnostic imaging of breast: Secondary | ICD-10-CM

## 2016-10-04 ENCOUNTER — Ambulatory Visit
Admission: RE | Admit: 2016-10-04 | Discharge: 2016-10-04 | Disposition: A | Discharge status: Home / Self Care | Payer: 59 | Source: Ambulatory Visit | Attending: Gynecology | Admitting: Gynecology

## 2016-10-04 DIAGNOSIS — R928 Other abnormal and inconclusive findings on diagnostic imaging of breast: Secondary | ICD-10-CM

## 2017-03-21 ENCOUNTER — Encounter: Payer: Self-pay | Admitting: Gynecology

## 2017-05-25 ENCOUNTER — Encounter: Payer: Self-pay | Admitting: Gynecology

## 2017-05-25 ENCOUNTER — Ambulatory Visit (INDEPENDENT_AMBULATORY_CARE_PROVIDER_SITE_OTHER): Payer: 59 | Admitting: Gynecology

## 2017-05-25 VITALS — BP 124/82 | Ht 63.0 in | Wt 132.0 lb

## 2017-05-25 DIAGNOSIS — Z01419 Encounter for gynecological examination (general) (routine) without abnormal findings: Secondary | ICD-10-CM | POA: Diagnosis not present

## 2017-05-25 NOTE — Progress Notes (Signed)
Sheila Bentley July 06, 1966 [id number]   History:    51 y.o.  for annual gyn exam with no complaints today.Patient was a former patient of Dr. Ubaldo Glassing who in 2013 today transvaginal hysterectomy as a result of menorrhagia and anemia. She has done well since her surgery. Review of her records indicated that in 2012 for bilateral breast nodularity since she had a mammogram and ultrasound which the findings demonstrated bilateral simple cysts. In 2013 and 2014 her mammogram was described as normal. Patient with no prior history of abnormal Pap smears. Patient with very mild and infrequent hot flashes. Patient reports normal colonoscopy in 2016. Patient had been evaluated by the medical oncologist as a result of low white blood count and the following was firm her last visit with him in 2017:  "The patient have chronic leukopenia since 2013. She is not symptomatic.  She is taking chronic vitamin B 12 supplement. That would have excluded vitamin B 12 deficiency I suspect she may has congenital leukopenia due to her African-American heritage. We discussed potential workup including additional blood work for other nutritional deficiency, screening for autoimmune disorder and viral infection. Ultimately, the patient has made informed decision not to pursue additional workup. We discussed genetic inheritance. It is likely that her daughter may have congenital leukopenia as well I warned her potential bone marrow suppression during times of extreme stress or infection that might make her white count dropped even lower. I have not made a return appointment for the patient to come back"   Past medical history,surgical history, family history and social history were all reviewed and documented in the EPIC chart.  Gynecologic History Patient's last menstrual period was 07/21/2012. Contraception: status post hysterectomy Last Pap: Several years ago. Results were: normal Last mammogram: 2017. Results were:  Benign cyst left breast  Obstetric History OB History  Gravida Para Term Preterm AB Living  2 1     1 1   SAB TAB Ectopic Multiple Live Births  1            # Outcome Date GA Lbr Len/2nd Weight Sex Delivery Anes PTL Lv  2 SAB           1 Para                ROS: A ROS was performed and pertinent positives and negatives are included in the history.  GENERAL: No fevers or chills. HEENT: No change in vision, no earache, sore throat or sinus congestion. NECK: No pain or stiffness. CARDIOVASCULAR: No chest pain or pressure. No palpitations. PULMONARY: No shortness of breath, cough or wheeze. GASTROINTESTINAL: No abdominal pain, nausea, vomiting or diarrhea, melena or bright red blood per rectum. GENITOURINARY: No urinary frequency, urgency, hesitancy or dysuria. MUSCULOSKELETAL: No joint or muscle pain, no back pain, no recent trauma. DERMATOLOGIC: No rash, no itching, no lesions. ENDOCRINE: No polyuria, polydipsia, no heat or cold intolerance. No recent change in weight. HEMATOLOGICAL: No anemia or easy bruising or bleeding. NEUROLOGIC: No headache, seizures, numbness, tingling or weakness. PSYCHIATRIC: No depression, no loss of interest in normal activity or change in sleep pattern.     Exam: chaperone present  BP 124/82   Ht 5\' 3"  (1.6 m)   Wt 132 lb (59.9 kg)   LMP 07/21/2012   BMI 23.38 kg/m   Body mass index is 23.38 kg/m.  General appearance : Well developed well nourished female. No acute distress HEENT: Eyes: no retinal hemorrhage or exudates,  Neck supple,  trachea midline, no carotid bruits, no thyroidmegaly Lungs: Clear to auscultation, no rhonchi or wheezes, or rib retractions  Heart: Regular rate and rhythm, no murmurs or gallops Breast:Examined in sitting and supine position were symmetrical in appearance, no palpable masses or tenderness,  no skin retraction, no nipple inversion, no nipple discharge, no skin discoloration, no axillary or supraclavicular  lymphadenopathy Abdomen: no palpable masses or tenderness, no rebound or guarding Extremities: no edema or skin discoloration or tenderness  Pelvic:  Bartholin, Urethra, Skene Glands: Within normal limits             Vagina: No gross lesions or discharge  Cervix: Absent  Uterus  absent  Adnexa  Without masses or tenderness  Anus and perineum  normal   Rectovaginal  normal sphincter tone without palpated masses or tenderness             Hemoccult had colonoscopy within 12 months     Assessment/Plan:  51 y.o. female for annual exam with past history of chronic leukopenia been evaluated by medical oncologist as discussed above. Patient will return back next week for fasting blood work assisting of the following: Comprehensive metabolic panel, fasting lipid profile, TSH, CBC, and urinalysis. Pap smear not indicated according to new guidelines. Patient to schedule her mammogram. Colonoscopy less than one year ago. Patient will need a bone density study next year.   Terrance Mass MD, 10:16 AM 05/25/2017

## 2017-05-26 LAB — URINALYSIS W MICROSCOPIC + REFLEX CULTURE
Bacteria, UA: NONE SEEN [HPF]
Bilirubin Urine: NEGATIVE
CASTS: NONE SEEN [LPF]
CRYSTALS: NONE SEEN [HPF]
Glucose, UA: NEGATIVE
HGB URINE DIPSTICK: NEGATIVE
Ketones, ur: NEGATIVE
Leukocytes, UA: NEGATIVE
Nitrite: NEGATIVE
PROTEIN: NEGATIVE
RBC / HPF: NONE SEEN RBC/HPF (ref <=2)
SQUAMOUS EPITHELIAL / LPF: NONE SEEN [HPF] (ref <=5)
Specific Gravity, Urine: 1.005 (ref 1.001–1.035)
WBC, UA: NONE SEEN WBC/HPF (ref <=5)
Yeast: NONE SEEN [HPF]
pH: 6 (ref 5.0–8.0)

## 2017-05-29 ENCOUNTER — Other Ambulatory Visit: Payer: 59

## 2017-05-29 LAB — COMPREHENSIVE METABOLIC PANEL
ALBUMIN: 4.3 g/dL (ref 3.6–5.1)
ALT: 11 U/L (ref 6–29)
AST: 18 U/L (ref 10–35)
Alkaline Phosphatase: 50 U/L (ref 33–130)
BILIRUBIN TOTAL: 1 mg/dL (ref 0.2–1.2)
BUN: 15 mg/dL (ref 7–25)
CHLORIDE: 106 mmol/L (ref 98–110)
CO2: 22 mmol/L (ref 20–31)
CREATININE: 0.78 mg/dL (ref 0.50–1.05)
Calcium: 9.1 mg/dL (ref 8.6–10.4)
GLUCOSE: 84 mg/dL (ref 65–99)
Potassium: 4.1 mmol/L (ref 3.5–5.3)
SODIUM: 140 mmol/L (ref 135–146)
Total Protein: 6.9 g/dL (ref 6.1–8.1)

## 2017-05-29 LAB — CBC WITH DIFFERENTIAL/PLATELET
BASOS ABS: 32 {cells}/uL (ref 0–200)
Basophils Relative: 1 %
EOS PCT: 3 %
Eosinophils Absolute: 96 cells/uL (ref 15–500)
HCT: 35.8 % (ref 35.0–45.0)
HEMOGLOBIN: 11.7 g/dL (ref 11.7–15.5)
LYMPHS ABS: 1472 {cells}/uL (ref 850–3900)
Lymphocytes Relative: 46 %
MCH: 26.8 pg — AB (ref 27.0–33.0)
MCHC: 32.7 g/dL (ref 32.0–36.0)
MCV: 82.1 fL (ref 80.0–100.0)
MONOS PCT: 7 %
MPV: 9.9 fL (ref 7.5–12.5)
Monocytes Absolute: 224 cells/uL (ref 200–950)
NEUTROS PCT: 43 %
Neutro Abs: 1376 cells/uL — ABNORMAL LOW (ref 1500–7800)
PLATELETS: 264 10*3/uL (ref 140–400)
RBC: 4.36 MIL/uL (ref 3.80–5.10)
RDW: 14.3 % (ref 11.0–15.0)
WBC: 3.2 10*3/uL — AB (ref 3.8–10.8)

## 2017-05-29 LAB — LIPID PANEL
Cholesterol: 218 mg/dL — ABNORMAL HIGH (ref <200)
HDL: 84 mg/dL (ref >50)
LDL CALC: 124 mg/dL — AB (ref <100)
Total CHOL/HDL Ratio: 2.6 Ratio (ref <5.0)
Triglycerides: 51 mg/dL (ref <150)
VLDL: 10 mg/dL (ref <30)

## 2017-05-29 LAB — TSH: TSH: 0.85 m[IU]/L

## 2017-07-03 DIAGNOSIS — L728 Other follicular cysts of the skin and subcutaneous tissue: Secondary | ICD-10-CM | POA: Diagnosis not present

## 2017-07-03 DIAGNOSIS — L209 Atopic dermatitis, unspecified: Secondary | ICD-10-CM | POA: Diagnosis not present

## 2017-07-17 DIAGNOSIS — L710 Perioral dermatitis: Secondary | ICD-10-CM | POA: Diagnosis not present

## 2017-08-29 ENCOUNTER — Other Ambulatory Visit: Payer: Self-pay | Admitting: Gynecology

## 2017-08-29 ENCOUNTER — Other Ambulatory Visit: Payer: Self-pay | Admitting: Obstetrics & Gynecology

## 2017-08-29 DIAGNOSIS — Z1231 Encounter for screening mammogram for malignant neoplasm of breast: Secondary | ICD-10-CM

## 2017-09-07 DIAGNOSIS — Z23 Encounter for immunization: Secondary | ICD-10-CM | POA: Diagnosis not present

## 2017-09-17 DIAGNOSIS — L299 Pruritus, unspecified: Secondary | ICD-10-CM | POA: Diagnosis not present

## 2017-09-17 DIAGNOSIS — L710 Perioral dermatitis: Secondary | ICD-10-CM | POA: Diagnosis not present

## 2017-10-04 ENCOUNTER — Ambulatory Visit
Admission: RE | Admit: 2017-10-04 | Discharge: 2017-10-04 | Disposition: A | Discharge status: Home / Self Care | Payer: 59 | Source: Ambulatory Visit | Attending: Obstetrics & Gynecology | Admitting: Obstetrics & Gynecology

## 2017-10-04 DIAGNOSIS — Z1231 Encounter for screening mammogram for malignant neoplasm of breast: Secondary | ICD-10-CM | POA: Diagnosis not present

## 2017-11-01 DIAGNOSIS — L720 Epidermal cyst: Secondary | ICD-10-CM | POA: Diagnosis not present

## 2018-05-27 ENCOUNTER — Encounter: Payer: Self-pay | Admitting: Obstetrics & Gynecology

## 2018-05-27 ENCOUNTER — Ambulatory Visit: Payer: 59 | Admitting: Obstetrics & Gynecology

## 2018-05-27 VITALS — BP 126/84 | Ht 64.0 in | Wt 137.0 lb

## 2018-05-27 DIAGNOSIS — Z90710 Acquired absence of both cervix and uterus: Secondary | ICD-10-CM

## 2018-05-27 DIAGNOSIS — N952 Postmenopausal atrophic vaginitis: Secondary | ICD-10-CM | POA: Diagnosis not present

## 2018-05-27 DIAGNOSIS — Z01419 Encounter for gynecological examination (general) (routine) without abnormal findings: Secondary | ICD-10-CM

## 2018-05-27 NOTE — Progress Notes (Signed)
Sheila Bentley [birth year] [id number]   History:    52 y.o. G2P1A1L1 Married.  Daughter 22+ at [employer] in Nursing  RP:  Established patient presenting for annual gyn exam   HPI: Total Hysterectomy for Menorrhagia.  C/O dryness/pain with IC.  Rare/mild hot flushes and night sweats.  No pelvic pain.  Urine/BMs wnl.  Breasts wnl.  BMI 23.52.  Health labs with family physician.  Past medical history,surgical history, family history and social history were all reviewed and documented in the EPIC chart.  Gynecologic History Patient's last menstrual period was 07/21/2012. Contraception: status post hysterectomy Last Pap: 2013. Results were: Negative Last mammogram: 09/2017. Results were: Negative Bone Density: Never Colonoscopy: Never  Obstetric History OB History  Gravida Para Term Preterm AB Living  2 1     1 1   SAB TAB Ectopic Multiple Live Births  1            # Outcome Date GA Lbr Len/2nd Weight Sex Delivery Anes PTL Lv  2 SAB           1 Para              ROS: A ROS was performed and pertinent positives and negatives are included in the history.  GENERAL: No fevers or chills. HEENT: No change in vision, no earache, sore throat or sinus congestion. NECK: No pain or stiffness. CARDIOVASCULAR: No chest pain or pressure. No palpitations. PULMONARY: No shortness of breath, cough or wheeze. GASTROINTESTINAL: No abdominal pain, nausea, vomiting or diarrhea, melena or bright red blood per rectum. GENITOURINARY: No urinary frequency, urgency, hesitancy or dysuria. MUSCULOSKELETAL: No joint or muscle pain, no back pain, no recent trauma. DERMATOLOGIC: No rash, no itching, no lesions. ENDOCRINE: No polyuria, polydipsia, no heat or cold intolerance. No recent change in weight. HEMATOLOGICAL: No anemia or easy bruising or bleeding. NEUROLOGIC: No headache, seizures, numbness, tingling or weakness. PSYCHIATRIC: No depression, no loss of interest in normal activity or change in sleep pattern.      Exam:   BP 126/84   Ht 5\' 4"  (1.626 m)   Wt 137 lb (62.1 kg)   LMP 07/21/2012   BMI 23.52 kg/m   Body mass index is 23.52 kg/m.  General appearance : Well developed well nourished female. No acute distress HEENT: Eyes: no retinal hemorrhage or exudates,  Neck supple, trachea midline, no carotid bruits, no thyroidmegaly Lungs: Clear to auscultation, no rhonchi or wheezes, or rib retractions  Heart: Regular rate and rhythm, no murmurs or gallops Breast:Examined in sitting and supine position were symmetrical in appearance, no palpable masses or tenderness,  no skin retraction, no nipple inversion, no nipple discharge, no skin discoloration, no axillary or supraclavicular lymphadenopathy Abdomen: no palpable masses or tenderness, no rebound or guarding Extremities: no edema or skin discoloration or tenderness  Pelvic: Vulva: Normal             Vagina: No gross lesions or discharge.  Pap reflex done.  Cervix/Uterus absent  Adnexa  Without masses or tenderness  Anus: Normal   Assessment/Plan:  52 y.o. female for annual exam   1. Well woman exam with routine gynecological exam Gynecologic exam status post total hysterectomy.  Pap reflex done on the vaginal vault today.  Breast exam normal.  Last screening mammogram was negative in November 2018.  Health labs with family physician.  Colonoscopy done in 2015.  2. History of total hysterectomy  3. Perimenopausal atrophic vaginitis Probably entering menopause with very mild  vasomotor symptoms, but complaining of vaginal dryness and pain with intercourse.  Will try Coconut oil.  If not enough, will call back for Vagifem (Estrogen tab vaginally) or Estrogen cream.  We will check [place] today to verify menopausal status.  Recommend vitamin D supplements, calcium rich nutrition and regular weightbearing physical activities - [facility]   Princess Bruins MD, 9:06 AM 05/27/2018

## 2018-05-28 ENCOUNTER — Encounter: Payer: Self-pay | Admitting: Obstetrics & Gynecology

## 2018-05-28 LAB — PAP IG W/ RFLX HPV ASCU

## 2018-05-28 LAB — FOLLICLE STIMULATING HORMONE: FSH: 116.7 m[IU]/mL — ABNORMAL HIGH

## 2018-05-28 NOTE — Patient Instructions (Signed)
1. Well woman exam with routine gynecological exam Gynecologic exam status post total hysterectomy.  Pap reflex done on the vaginal vault today.  Breast exam normal.  Last screening mammogram was negative in November 2018.  Health labs with family physician.  Colonoscopy done in 2015.  2. History of total hysterectomy  3. Perimenopausal atrophic vaginitis Probably entering menopause with very mild vasomotor symptoms, but complaining of vaginal dryness and pain with intercourse.  Will try Coconut oil.  If not enough, will call back for Vagifem (Estrogen tab vaginally) or Estrogen cream.  We will check [place] today to verify menopausal status.  Recommend vitamin D supplements, calcium rich nutrition and regular weightbearing physical activities - [facility]  Betrice, it was a pleasure meeting you today!  I will inform you of your results as soon as they are available.

## 2018-05-31 ENCOUNTER — Encounter: Payer: 59 | Admitting: Obstetrics & Gynecology

## 2018-08-26 ENCOUNTER — Other Ambulatory Visit: Payer: Self-pay | Admitting: Obstetrics & Gynecology

## 2018-08-26 DIAGNOSIS — Z1231 Encounter for screening mammogram for malignant neoplasm of breast: Secondary | ICD-10-CM

## 2018-08-28 DIAGNOSIS — Z23 Encounter for immunization: Secondary | ICD-10-CM | POA: Diagnosis not present

## 2018-09-15 IMAGING — MG 2D DIGITAL SCREENING BILATERAL MAMMOGRAM WITH CAD AND ADJUNCT TO
9 of 13 series · 9 of 29 positions shown · non-contrast
Comparison: Previous exam(s).

CLINICAL DATA: Screening.

EXAM:
2D DIGITAL SCREENING BILATERAL MAMMOGRAM WITH CAD AND ADJUNCT TOMO

[L MLO (1 of 2)]
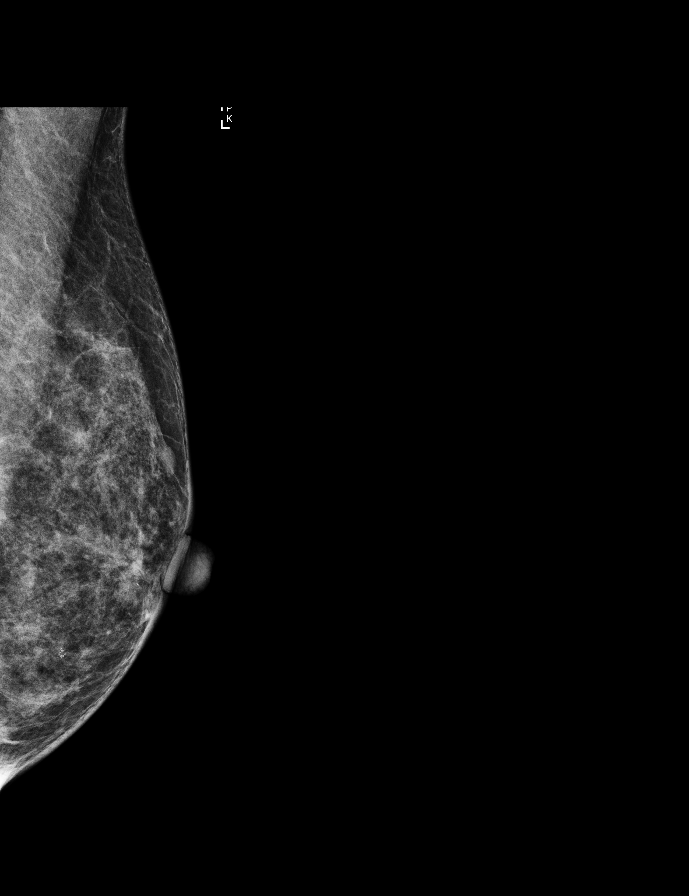

[L CC]
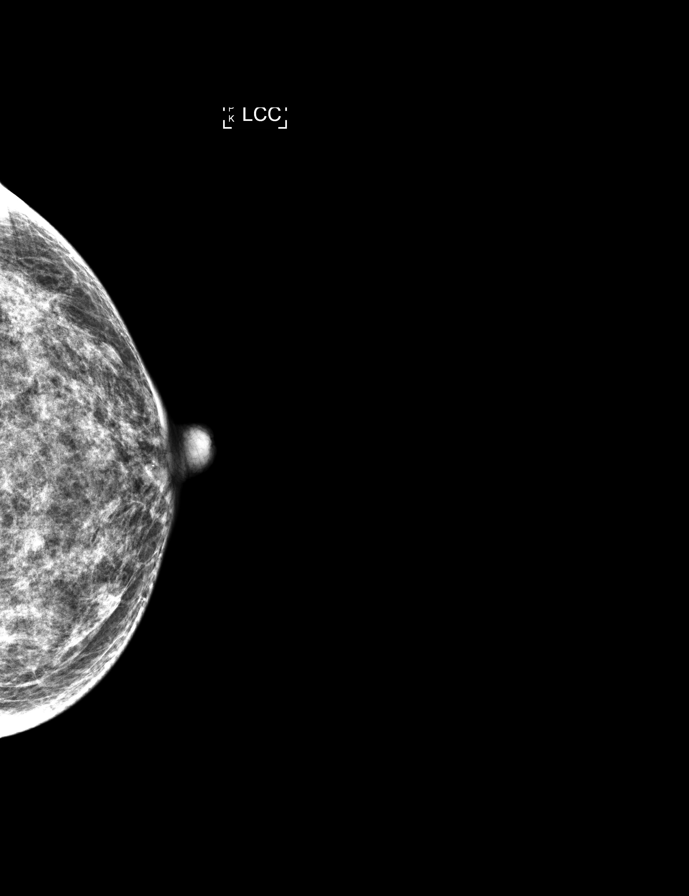

[R MLO synth-2D]
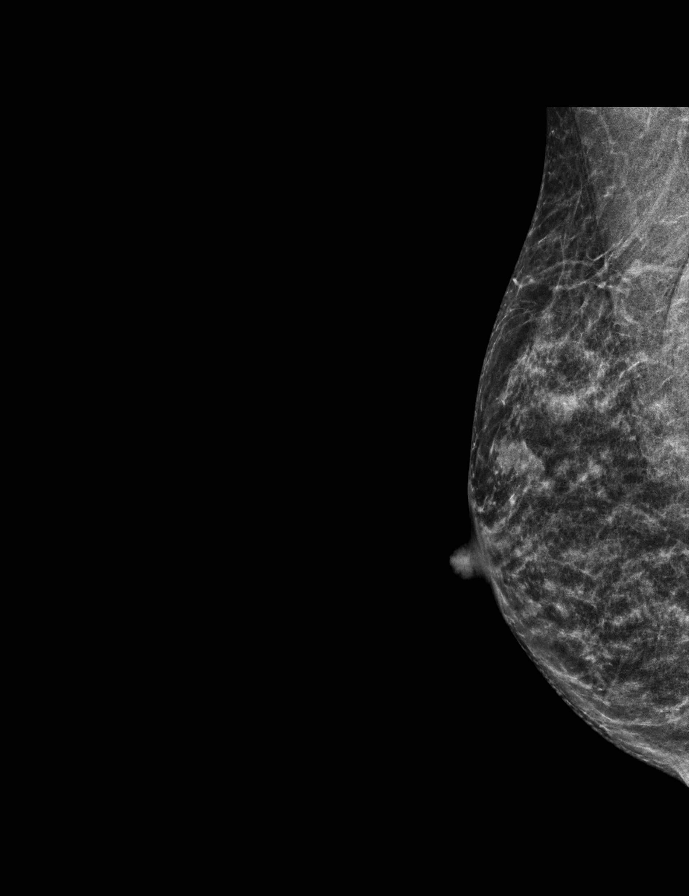

[R CC synth-2D]
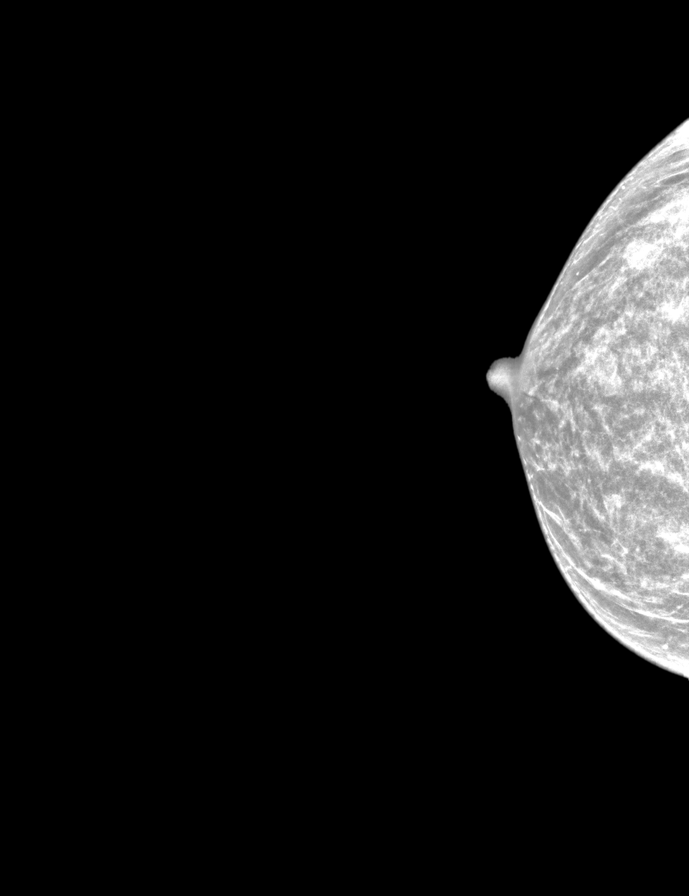

[R MLO]
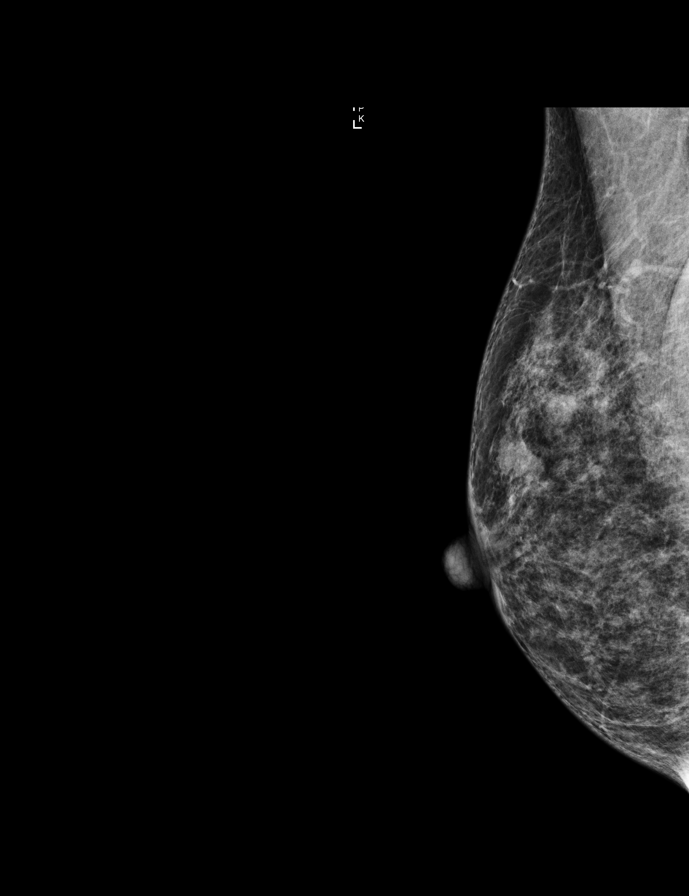

[L MLO (2 of 2)]
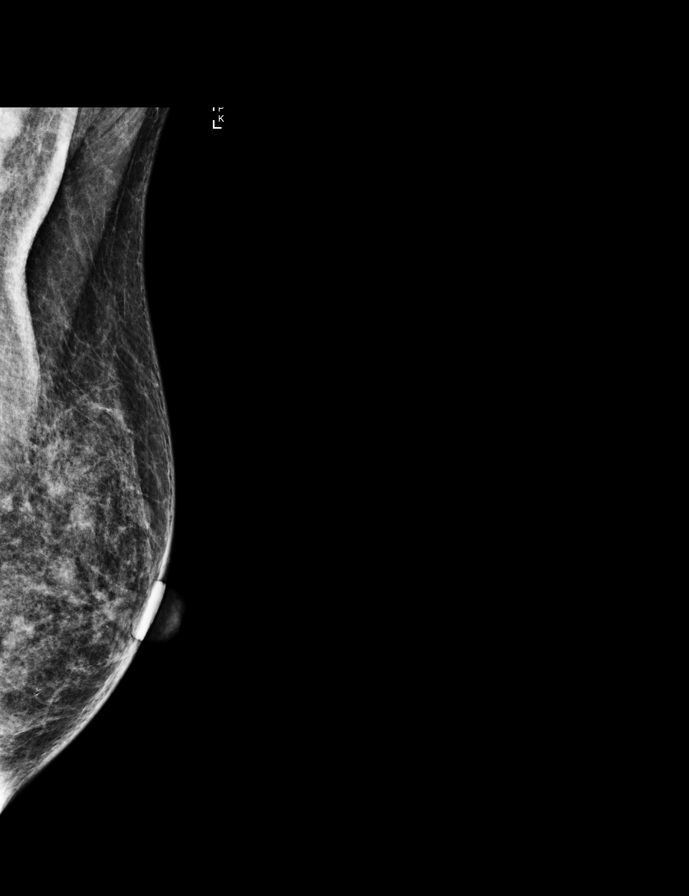

[L CC synth-2D]
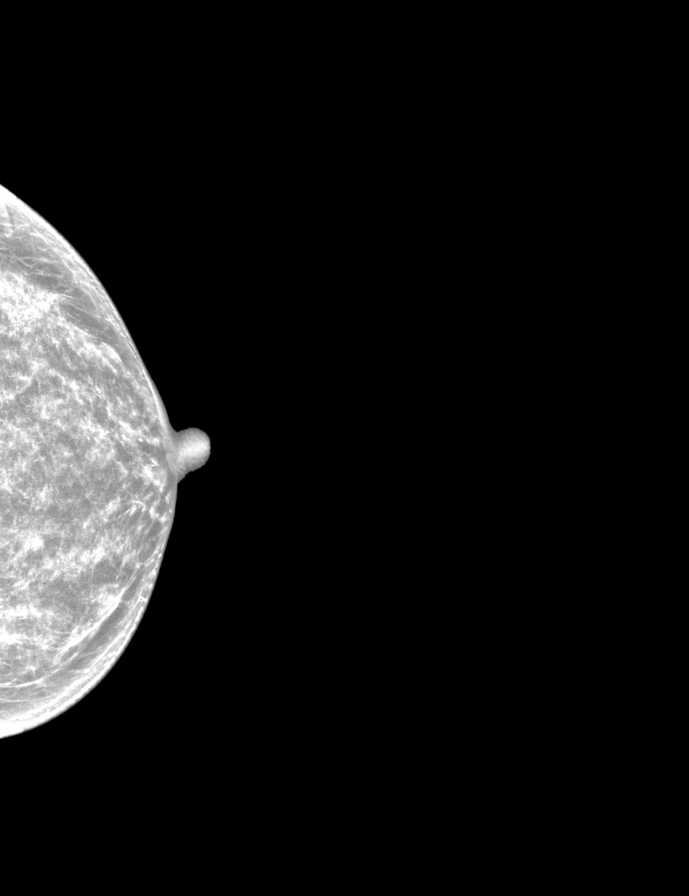

[L MLO synth-2D]
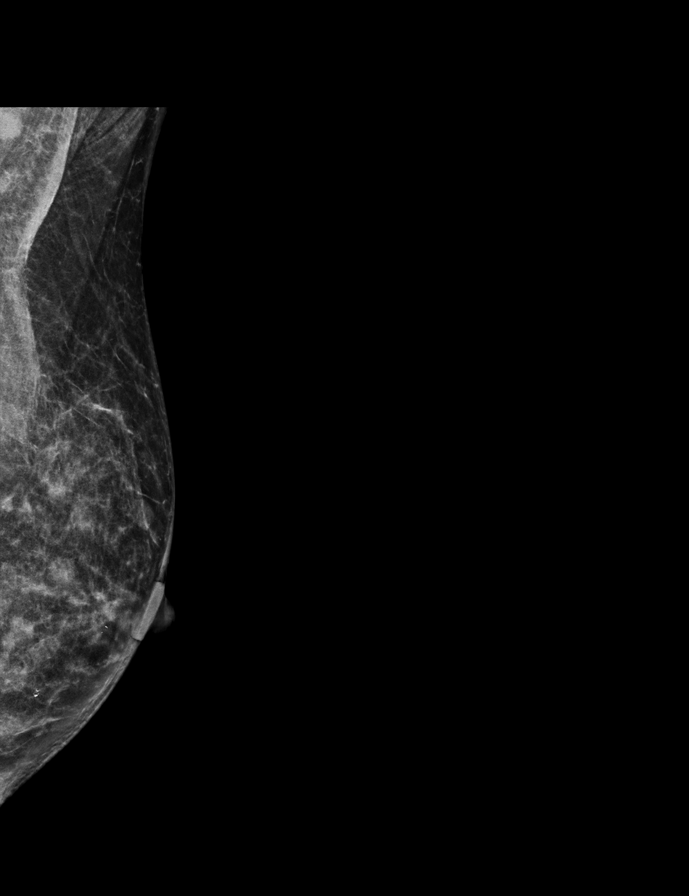

[R CC]
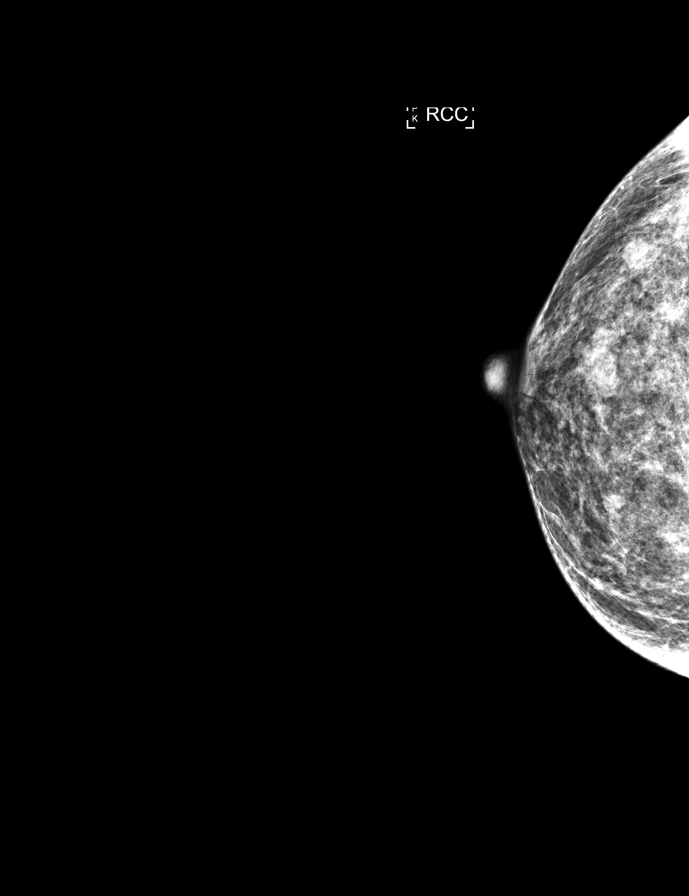

[9 of 29 positions shown; findings below may reference images not displayed]

ACR Breast Density Category c: The breast tissue is heterogeneously
dense, which may obscure small masses.
FINDINGS: There are no findings suspicious for malignancy. Images were
processed with CAD.
IMPRESSION: No mammographic evidence of malignancy. A result letter of this
screening mammogram will be mailed directly to the patient.

RECOMMENDATION:
Screening mammogram in one year. (Code:TN-[id number])

BI-RADS CATEGORY  1: Negative.

## 2018-10-07 ENCOUNTER — Ambulatory Visit
Admission: RE | Admit: 2018-10-07 | Discharge: 2018-10-07 | Disposition: A | Discharge status: Home / Self Care | Payer: 59 | Source: Ambulatory Visit | Attending: Obstetrics & Gynecology | Admitting: Obstetrics & Gynecology

## 2018-10-07 DIAGNOSIS — Z1231 Encounter for screening mammogram for malignant neoplasm of breast: Secondary | ICD-10-CM | POA: Diagnosis not present

## 2019-03-06 DIAGNOSIS — Z6821 Body mass index (BMI) 21.0-21.9, adult: Secondary | ICD-10-CM | POA: Diagnosis not present

## 2019-03-06 DIAGNOSIS — E559 Vitamin D deficiency, unspecified: Secondary | ICD-10-CM | POA: Diagnosis not present

## 2019-03-06 DIAGNOSIS — E785 Hyperlipidemia, unspecified: Secondary | ICD-10-CM | POA: Diagnosis not present

## 2019-03-13 DIAGNOSIS — E559 Vitamin D deficiency, unspecified: Secondary | ICD-10-CM | POA: Diagnosis not present

## 2019-03-13 DIAGNOSIS — D72819 Decreased white blood cell count, unspecified: Secondary | ICD-10-CM | POA: Diagnosis not present

## 2019-03-13 DIAGNOSIS — Z79899 Other long term (current) drug therapy: Secondary | ICD-10-CM | POA: Diagnosis not present

## 2019-03-13 DIAGNOSIS — E785 Hyperlipidemia, unspecified: Secondary | ICD-10-CM | POA: Diagnosis not present

## 2019-05-28 ENCOUNTER — Other Ambulatory Visit: Payer: Self-pay

## 2019-05-29 ENCOUNTER — Encounter: Payer: Self-pay | Admitting: Obstetrics & Gynecology

## 2019-05-29 ENCOUNTER — Ambulatory Visit (INDEPENDENT_AMBULATORY_CARE_PROVIDER_SITE_OTHER): Payer: 59 | Admitting: Obstetrics & Gynecology

## 2019-05-29 VITALS — BP 126/78 | Ht 64.0 in | Wt 136.0 lb

## 2019-05-29 DIAGNOSIS — Z01419 Encounter for gynecological examination (general) (routine) without abnormal findings: Secondary | ICD-10-CM | POA: Diagnosis not present

## 2019-05-29 DIAGNOSIS — Z90710 Acquired absence of both cervix and uterus: Secondary | ICD-10-CM | POA: Diagnosis not present

## 2019-05-29 DIAGNOSIS — Z780 Asymptomatic menopausal state: Secondary | ICD-10-CM

## 2019-05-29 NOTE — Progress Notes (Signed)
Sheila Bentley 1966-03-10 [id number]   History:    53 y.o. G2P1A1L1 Married.  Daughter 42 yo pre Nursing at [employer].  RP:  Established patient presenting for annual gyn exam   HPI: S/P Total Hysterectomy for Menorrhagia.  Menopause, well on no HRT.  No pelvic pain.  No pain with IC, using coconut oil.  Urine/BMs normal.  Breasts normal.  BMI 23.34.  Good fitness and healthy nutrition.  Health labs with Fam MD.  Harriet Masson 2015.  Past medical history,surgical history, family history and social history were all reviewed and documented in the EPIC chart.  Gynecologic History Patient's last menstrual period was 07/21/2012. Contraception: status post hysterectomy Last Pap: 05/2018. Results were: Negative Last mammogram: 10/2018. Results were: Negative Bone Density: Never Colonoscopy: 2015  Obstetric History OB History  Gravida Para Term Preterm AB Living  2 1     1 1   SAB TAB Ectopic Multiple Live Births  1            # Outcome Date GA Lbr Len/2nd Weight Sex Delivery Anes PTL Lv  2 SAB           1 Para              ROS: A ROS was performed and pertinent positives and negatives are included in the history.  GENERAL: No fevers or chills. HEENT: No change in vision, no earache, sore throat or sinus congestion. NECK: No pain or stiffness. CARDIOVASCULAR: No chest pain or pressure. No palpitations. PULMONARY: No shortness of breath, cough or wheeze. GASTROINTESTINAL: No abdominal pain, nausea, vomiting or diarrhea, melena or bright red blood per rectum. GENITOURINARY: No urinary frequency, urgency, hesitancy or dysuria. MUSCULOSKELETAL: No joint or muscle pain, no back pain, no recent trauma. DERMATOLOGIC: No rash, no itching, no lesions. ENDOCRINE: No polyuria, polydipsia, no heat or cold intolerance. No recent change in weight. HEMATOLOGICAL: No anemia or easy bruising or bleeding. NEUROLOGIC: No headache, seizures, numbness, tingling or weakness. PSYCHIATRIC: No depression, no loss of interest in  normal activity or change in sleep pattern.     Exam:   BP 126/78    Ht 5\' 4"  (1.626 m)    Wt 136 lb (61.7 kg)    LMP 07/21/2012    BMI 23.34 kg/m   Body mass index is 23.34 kg/m.  General appearance : Well developed well nourished female. No acute distress HEENT: Eyes: no retinal hemorrhage or exudates,  Neck supple, trachea midline, no carotid bruits, no thyroidmegaly Lungs: Clear to auscultation, no rhonchi or wheezes, or rib retractions  Heart: Regular rate and rhythm, no murmurs or gallops Breast:Examined in sitting and supine position were symmetrical in appearance, no palpable masses or tenderness,  no skin retraction, no nipple inversion, no nipple discharge, no skin discoloration, no axillary or supraclavicular lymphadenopathy Abdomen: no palpable masses or tenderness, no rebound or guarding Extremities: no edema or skin discoloration or tenderness  Pelvic: Vulva: Normal             Vagina: No gross lesions or discharge  Cervix/Uterus absent  Adnexa  Without masses or tenderness  Anus: Normal   Assessment/Plan:  53 y.o. female for annual exam   1. Well female exam with routine gynecological exam Gynecologic exam status post total hysterectomy and menopause.  Pap test July 2019 was negative, no indication to repeat this year.  Breast exam normal.  Screening mammogram December 2019 was negative.  Colonoscopy normal in 2015.  Health labs with family physician.  Good body mass index at 23.34.  Continue with fitness and healthy nutrition.  2. History of total hysterectomy  3. Postmenopause Well on no hormone replacement therapy.  Recommend vitamin D supplements, calcium intake of 1200 mg daily and regular weightbearing physical activities to continue.  Princess Bruins MD, 9:17 AM 05/29/2019

## 2019-05-29 NOTE — Patient Instructions (Signed)
1. Well female exam with routine gynecological exam Gynecologic exam status post total hysterectomy and menopause.  Pap test July 2019 was negative, no indication to repeat this year.  Breast exam normal.  Screening mammogram December 2019 was negative.  Colonoscopy normal in 2015.  Health labs with family physician.  Good body mass index at 23.34.  Continue with fitness and healthy nutrition.  2. History of total hysterectomy  3. Postmenopause Well on no hormone replacement therapy.  Recommend vitamin D supplements, calcium intake of 1200 mg daily and regular weightbearing physical activities to continue.  Sheila Bentley, it was a pleasure seeing you today!

## 2019-09-01 ENCOUNTER — Other Ambulatory Visit: Payer: Self-pay | Admitting: Obstetrics & Gynecology

## 2019-09-01 DIAGNOSIS — Z1231 Encounter for screening mammogram for malignant neoplasm of breast: Secondary | ICD-10-CM

## 2019-10-22 ENCOUNTER — Ambulatory Visit: Payer: 59

## 2019-12-08 ENCOUNTER — Ambulatory Visit
Admission: RE | Admit: 2019-12-08 | Discharge: 2019-12-08 | Disposition: A | Discharge status: Home / Self Care | Payer: 59 | Source: Ambulatory Visit | Attending: Obstetrics & Gynecology | Admitting: Obstetrics & Gynecology

## 2019-12-08 ENCOUNTER — Other Ambulatory Visit: Payer: Self-pay

## 2019-12-08 DIAGNOSIS — Z1231 Encounter for screening mammogram for malignant neoplasm of breast: Secondary | ICD-10-CM

## 2020-05-31 ENCOUNTER — Encounter: Payer: 59 | Admitting: Obstetrics & Gynecology

## 2020-06-03 ENCOUNTER — Ambulatory Visit: Payer: 59 | Admitting: Obstetrics & Gynecology

## 2020-06-03 ENCOUNTER — Encounter: Payer: Self-pay | Admitting: Obstetrics & Gynecology

## 2020-06-03 ENCOUNTER — Other Ambulatory Visit: Payer: Self-pay

## 2020-06-03 VITALS — BP 120/74 | Ht 64.0 in | Wt 135.0 lb

## 2020-06-03 DIAGNOSIS — Z90710 Acquired absence of both cervix and uterus: Secondary | ICD-10-CM | POA: Diagnosis not present

## 2020-06-03 DIAGNOSIS — Z01419 Encounter for gynecological examination (general) (routine) without abnormal findings: Secondary | ICD-10-CM | POA: Diagnosis not present

## 2020-06-03 DIAGNOSIS — Z780 Asymptomatic menopausal state: Secondary | ICD-10-CM | POA: Diagnosis not present

## 2020-06-03 NOTE — Progress Notes (Signed)
Sheila Bentley Mar 31, 1966 [id number]   History:    54 y.o.  G2P1A1L1 Married.  Daughter 79 yo Married and just had a daughter, in pre Nursing at [employer].  RP:  Established patient presenting for annual gyn exam   HPI: S/P Total Hysterectomy for Menorrhagia.  Menopause, well on no HRT.  No pelvic pain.  No pain with IC, using coconut oil.  Urine/BMs normal.  Breasts normal.  BMI 23.17.  Good fitness and healthy nutrition.  Health labs with Fam MD.  Harriet Masson 2015.  Past medical history,surgical history, family history and social history were all reviewed and documented in the EPIC chart.  Gynecologic History Patient's last menstrual period was 07/21/2012.  Obstetric History OB History  Gravida Para Term Preterm AB Living  2 1     1 1   SAB TAB Ectopic Multiple Live Births  1            # Outcome Date GA Lbr Len/2nd Weight Sex Delivery Anes PTL Lv  2 SAB           1 Para              ROS: A ROS was performed and pertinent positives and negatives are included in the history.  GENERAL: No fevers or chills. HEENT: No change in vision, no earache, sore throat or sinus congestion. NECK: No pain or stiffness. CARDIOVASCULAR: No chest pain or pressure. No palpitations. PULMONARY: No shortness of breath, cough or wheeze. GASTROINTESTINAL: No abdominal pain, nausea, vomiting or diarrhea, melena or bright red blood per rectum. GENITOURINARY: No urinary frequency, urgency, hesitancy or dysuria. MUSCULOSKELETAL: No joint or muscle pain, no back pain, no recent trauma. DERMATOLOGIC: No rash, no itching, no lesions. ENDOCRINE: No polyuria, polydipsia, no heat or cold intolerance. No recent change in weight. HEMATOLOGICAL: No anemia or easy bruising or bleeding. NEUROLOGIC: No headache, seizures, numbness, tingling or weakness. PSYCHIATRIC: No depression, no loss of interest in normal activity or change in sleep pattern.     Exam:   BP 120/74    Ht 5\' 4"  (1.626 m)    Wt 135 lb (61.2 kg)    LMP  07/21/2012    BMI 23.17 kg/m   Body mass index is 23.17 kg/m.  General appearance : Well developed well nourished female. No acute distress HEENT: Eyes: no retinal hemorrhage or exudates,  Neck supple, trachea midline, no carotid bruits, no thyroidmegaly Lungs: Clear to auscultation, no rhonchi or wheezes, or rib retractions  Heart: Regular rate and rhythm, no murmurs or gallops Breast:Examined in sitting and supine position were symmetrical in appearance, no palpable masses or tenderness,  no skin retraction, no nipple inversion, no nipple discharge, no skin discoloration, no axillary or supraclavicular lymphadenopathy Abdomen: no palpable masses or tenderness, no rebound or guarding Extremities: no edema or skin discoloration or tenderness  Pelvic: Vulva: Normal             Vagina: No gross lesions or discharge  Cervix/Uterus absent  Adnexa  Without masses or tenderness  Anus: Normal   Assessment/Plan:  54 y.o. female for annual exam   1. Well female exam with routine gynecological exam Gynecologic exam status post total hysterectomy.  No indication for Pap test this year.  Breast exam normal. Last mammogram February 2021 was negative.  Colonoscopy 2015.  Health labs with family physician.  Body mass index good at 23.17.  Continue with fitness and healthy nutrition.  2. History of total hysterectomy  3. Postmenopause  Well on no hormone replacement therapy.  Vitamin D supplements, calcium intake of 1200 mg daily and regular weight bearing physical activities.  Other orders - cholecalciferol (VITAMIN D3) 25 MCG (1000 UNIT) tablet; Take 1,000 Units by mouth daily.  Princess Bruins MD, 11:54 AM 06/03/2020

## 2020-11-01 ENCOUNTER — Other Ambulatory Visit: Payer: Self-pay | Admitting: Obstetrics & Gynecology

## 2020-11-01 DIAGNOSIS — Z1231 Encounter for screening mammogram for malignant neoplasm of breast: Secondary | ICD-10-CM

## 2020-12-08 ENCOUNTER — Other Ambulatory Visit: Payer: Self-pay

## 2020-12-08 ENCOUNTER — Ambulatory Visit
Admission: RE | Admit: 2020-12-08 | Discharge: 2020-12-08 | Disposition: A | Discharge status: Home / Self Care | Payer: 59 | Source: Ambulatory Visit | Attending: Obstetrics & Gynecology | Admitting: Obstetrics & Gynecology

## 2020-12-08 DIAGNOSIS — Z1231 Encounter for screening mammogram for malignant neoplasm of breast: Secondary | ICD-10-CM

## 2021-06-06 ENCOUNTER — Encounter: Payer: Self-pay | Admitting: Obstetrics & Gynecology

## 2021-06-06 ENCOUNTER — Other Ambulatory Visit (HOSPITAL_COMMUNITY)
Admission: RE | Admit: 2021-06-06 | Discharge: 2021-06-06 | Disposition: A | Discharge status: Home / Self Care | Payer: 59 | Source: Ambulatory Visit | Attending: Obstetrics & Gynecology | Admitting: Obstetrics & Gynecology

## 2021-06-06 ENCOUNTER — Other Ambulatory Visit: Payer: Self-pay

## 2021-06-06 ENCOUNTER — Ambulatory Visit (INDEPENDENT_AMBULATORY_CARE_PROVIDER_SITE_OTHER): Payer: 59 | Admitting: Obstetrics & Gynecology

## 2021-06-06 VITALS — BP 114/78 | HR 59 | Resp 16 | Ht 63.75 in | Wt 134.0 lb

## 2021-06-06 DIAGNOSIS — Z90710 Acquired absence of both cervix and uterus: Secondary | ICD-10-CM | POA: Diagnosis not present

## 2021-06-06 DIAGNOSIS — Z01419 Encounter for gynecological examination (general) (routine) without abnormal findings: Secondary | ICD-10-CM | POA: Diagnosis not present

## 2021-06-06 DIAGNOSIS — Z780 Asymptomatic menopausal state: Secondary | ICD-10-CM | POA: Diagnosis not present

## 2021-06-06 DIAGNOSIS — Z1272 Encounter for screening for malignant neoplasm of vagina: Secondary | ICD-10-CM | POA: Insufficient documentation

## 2021-06-06 NOTE — Progress Notes (Signed)
Sheila Bentley [birth year] [id number]   History:    55 y.o. G2P1A1L1 Married.  Daughter 69 yo Married and has a 101 yo daughter, Restaurant manager, fast food in speech therapy.   RP:  Established patient presenting for annual gyn exam   HPI: S/P Total Hysterectomy for Menorrhagia.  Postmenopause, well on no HRT.  No pelvic pain.  No pain with IC, using coconut oil.  Urine/BMs normal.  Breasts normal.  BMI 23.18.  Good fitness and healthy nutrition.  Health labs with Fam MD.  Harriet Masson 2015.   Past medical history,surgical history, family history and social history were all reviewed and documented in the EPIC chart.  Gynecologic History Patient's last menstrual period was 07/21/2012.  Obstetric History OB History  Gravida Para Term Preterm AB Living  '2 1     1 1  '$ SAB IAB Ectopic Multiple Live Births  1            # Outcome Date GA Lbr Len/2nd Weight Sex Delivery Anes PTL Lv  2 SAB           1 Para              ROS: A ROS was performed and pertinent positives and negatives are included in the history.  GENERAL: No fevers or chills. HEENT: No change in vision, no earache, sore throat or sinus congestion. NECK: No pain or stiffness. CARDIOVASCULAR: No chest pain or pressure. No palpitations. PULMONARY: No shortness of breath, cough or wheeze. GASTROINTESTINAL: No abdominal pain, nausea, vomiting or diarrhea, melena or bright red blood per rectum. GENITOURINARY: No urinary frequency, urgency, hesitancy or dysuria. MUSCULOSKELETAL: No joint or muscle pain, no back pain, no recent trauma. DERMATOLOGIC: No rash, no itching, no lesions. ENDOCRINE: No polyuria, polydipsia, no heat or cold intolerance. No recent change in weight. HEMATOLOGICAL: No anemia or easy bruising or bleeding. NEUROLOGIC: No headache, seizures, numbness, tingling or weakness. PSYCHIATRIC: No depression, no loss of interest in normal activity or change in sleep pattern.     Exam:   BP 114/78   Pulse (!) 59   Resp 16   Ht 5' 3.75" (1.619 m)    Wt 134 lb (60.8 kg)   LMP 07/21/2012   BMI 23.18 kg/m   Body mass index is 23.18 kg/m.  General appearance : Well developed well nourished female. No acute distress HEENT: Eyes: no retinal hemorrhage or exudates,  Neck supple, trachea midline, no carotid bruits, no thyroidmegaly Lungs: Clear to auscultation, no rhonchi or wheezes, or rib retractions  Heart: Regular rate and rhythm, no murmurs or gallops Breast:Examined in sitting and supine position were symmetrical in appearance, no palpable masses or tenderness,  no skin retraction, no nipple inversion, no nipple discharge, no skin discoloration, no axillary or supraclavicular lymphadenopathy Abdomen: no palpable masses or tenderness, no rebound or guarding Extremities: no edema or skin discoloration or tenderness  Pelvic: Vulva: Normal             Vagina: No gross lesions or discharge.  Pap reflex done.  Cervix/uterus absent  Adnexa  Without masses or tenderness  Anus: Normal   Assessment/Plan:  55 y.o. female for annual exam   1. Encounter for Papanicolaou smear of vagina as part of routine gynecological examination Gynecologic exam s/p Total Hysterectomy.  Pap reflex on the vaginal vault.  Breast exam normal.  Screening mammo neg 12/2020.  [place] 2015.  Health labs with Fam MD.  BMI good at 23.18.  Continue with fitness and healthy  nutrition. - Cytology - PAP( [place])  2. History of total hysterectomy  3. Postmenopause  Well on no HRT.  Continue Vit D/Ca++ and weight bearing physical activities.  Princess Bruins MD, 9:24 AM 06/06/2021

## 2021-06-09 LAB — CYTOLOGY - PAP
Comment: NEGATIVE
Diagnosis: UNDETERMINED — AB
High risk HPV: NEGATIVE

## 2022-01-13 ENCOUNTER — Other Ambulatory Visit: Payer: Self-pay | Admitting: Obstetrics & Gynecology

## 2022-01-13 DIAGNOSIS — Z1231 Encounter for screening mammogram for malignant neoplasm of breast: Secondary | ICD-10-CM

## 2022-02-07 ENCOUNTER — Ambulatory Visit
Admission: RE | Admit: 2022-02-07 | Discharge: 2022-02-07 | Disposition: A | Discharge status: Home / Self Care | Payer: 59 | Source: Ambulatory Visit | Attending: Obstetrics & Gynecology | Admitting: Obstetrics & Gynecology

## 2022-02-07 DIAGNOSIS — Z1231 Encounter for screening mammogram for malignant neoplasm of breast: Secondary | ICD-10-CM

## 2022-06-07 ENCOUNTER — Ambulatory Visit: Payer: 59 | Admitting: Obstetrics & Gynecology

## 2022-06-19 ENCOUNTER — Ambulatory Visit (INDEPENDENT_AMBULATORY_CARE_PROVIDER_SITE_OTHER): Payer: 59 | Admitting: Obstetrics & Gynecology

## 2022-06-19 ENCOUNTER — Other Ambulatory Visit (HOSPITAL_COMMUNITY)
Admission: RE | Admit: 2022-06-19 | Discharge: 2022-06-19 | Disposition: A | Discharge status: Home / Self Care | Payer: 59 | Source: Ambulatory Visit | Attending: Obstetrics & Gynecology | Admitting: Obstetrics & Gynecology

## 2022-06-19 ENCOUNTER — Encounter: Payer: Self-pay | Admitting: Obstetrics & Gynecology

## 2022-06-19 VITALS — BP 122/80 | Ht 63.0 in | Wt 132.0 lb

## 2022-06-19 DIAGNOSIS — R87610 Atypical squamous cells of undetermined significance on cytologic smear of cervix (ASC-US): Secondary | ICD-10-CM

## 2022-06-19 DIAGNOSIS — Z90710 Acquired absence of both cervix and uterus: Secondary | ICD-10-CM | POA: Diagnosis not present

## 2022-06-19 DIAGNOSIS — Z780 Asymptomatic menopausal state: Secondary | ICD-10-CM

## 2022-06-19 DIAGNOSIS — Z1272 Encounter for screening for malignant neoplasm of vagina: Secondary | ICD-10-CM

## 2022-06-19 DIAGNOSIS — Z01419 Encounter for gynecological examination (general) (routine) without abnormal findings: Secondary | ICD-10-CM | POA: Insufficient documentation

## 2022-06-19 NOTE — Progress Notes (Signed)
Sheila Bentley Nov 14, 1965 [id number]   History:    56 y.o. G2P1A1L1 Married.  Daughter 71 yo Married and has a 53 yo daughter, Restaurant manager, fast food in speech therapy.   RP:  Established patient presenting for annual gyn exam   HPI: S/P Total Hysterectomy for Menorrhagia.  Postmenopause, well on no HRT.  No pelvic pain.  No pain with IC, using coconut oil.  Pap ASCUS/HPV HR Neg in 06/2021. Pap reflex today. Urine/BMs normal.  Breasts normal.  Mammo Neg in 02/2022.  BMI 23.38.  Good fitness and healthy nutrition.  Health labs with [facility] 2015, will repeat in 2025.   Past medical history,surgical history, family history and social history were all reviewed and documented in the EPIC chart.  Gynecologic History Patient's last menstrual period was 07/21/2012.  Obstetric History OB History  Gravida Para Term Preterm AB Living  '2 1     1 1  '$ SAB IAB Ectopic Multiple Live Births  1            # Outcome Date GA Lbr Len/2nd Weight Sex Delivery Anes PTL Lv  2 SAB           1 Para              ROS: A ROS was performed and pertinent positives and negatives are included in the history.  GENERAL: No fevers or chills. HEENT: No change in vision, no earache, sore throat or sinus congestion. NECK: No pain or stiffness. CARDIOVASCULAR: No chest pain or pressure. No palpitations. PULMONARY: No shortness of breath, cough or wheeze. GASTROINTESTINAL: No abdominal pain, nausea, vomiting or diarrhea, melena or bright red blood per rectum. GENITOURINARY: No urinary frequency, urgency, hesitancy or dysuria. MUSCULOSKELETAL: No joint or muscle pain, no back pain, no recent trauma. DERMATOLOGIC: No rash, no itching, no lesions. ENDOCRINE: No polyuria, polydipsia, no heat or cold intolerance. No recent change in weight. HEMATOLOGICAL: No anemia or easy bruising or bleeding. NEUROLOGIC: No headache, seizures, numbness, tingling or weakness. PSYCHIATRIC: No depression, no loss of interest in normal activity or change in  sleep pattern.     Exam:   BP 122/80 (BP Location: Right Arm, Patient Position: Sitting, Cuff Size: Normal)   Ht '5\' 3"'$  (1.6 m)   Wt 132 lb (59.9 kg)   LMP 07/21/2012   BMI 23.38 kg/m   Body mass index is 23.38 kg/m.  General appearance : Well developed well nourished female. No acute distress HEENT: Eyes: no retinal hemorrhage or exudates,  Neck supple, trachea midline, no carotid bruits, no thyroidmegaly Lungs: Clear to auscultation, no rhonchi or wheezes, or rib retractions  Heart: Regular rate and rhythm, no murmurs or gallops Breast:Examined in sitting and supine position were symmetrical in appearance, no palpable masses or tenderness,  no skin retraction, no nipple inversion, no nipple discharge, no skin discoloration, no axillary or supraclavicular lymphadenopathy Abdomen: no palpable masses or tenderness, no rebound or guarding Extremities: no edema or skin discoloration or tenderness  Pelvic: Vulva: Normal             Vagina: No gross lesions or discharge.  Pap reflex done  Cervix/Uterus absent  Adnexa  Without masses or tenderness  Anus: Normal   Assessment/Plan:  56 y.o. female for annual exam   1. Encounter for Papanicolaou smear of vagina as part of routine gynecological examination S/P Total Hysterectomy for Menorrhagia.  Postmenopause, well on no HRT.  No pelvic pain.  No pain with IC, using coconut  oil.  Pap ASCUS/HPV HR Neg in 06/2021. Pap reflex today. Urine/BMs normal.  Breasts normal.  Mammo Neg in 02/2022.  BMI 23.38.  Good fitness and healthy nutrition.  Health labs with [facility] 2015, will repeat in 2025. - Cytology - PAP( [facility])  2. ASCUS of cervix with negative high risk HPV - Cytology - PAP( [facility])  3. History of total hysterectomy  4. Postmenopause  S/P Total Hysterectomy for Menorrhagia.  Postmenopause, well on no HRT.  No pelvic pain.  No pain with IC, using coconut oil.   Princess Bruins MD, 10:32 AM 06/19/2022

## 2022-06-25 LAB — CYTOLOGY - PAP: Diagnosis: REACTIVE

## 2023-01-09 ENCOUNTER — Other Ambulatory Visit: Payer: Self-pay | Admitting: Obstetrics & Gynecology

## 2023-01-09 DIAGNOSIS — Z1231 Encounter for screening mammogram for malignant neoplasm of breast: Secondary | ICD-10-CM

## 2023-02-20 ENCOUNTER — Ambulatory Visit
Admission: RE | Admit: 2023-02-20 | Discharge: 2023-02-20 | Disposition: A | Discharge status: Home / Self Care | Payer: 59 | Source: Ambulatory Visit | Attending: Obstetrics & Gynecology | Admitting: Obstetrics & Gynecology

## 2023-02-20 DIAGNOSIS — Z1231 Encounter for screening mammogram for malignant neoplasm of breast: Secondary | ICD-10-CM

## 2023-06-21 ENCOUNTER — Ambulatory Visit: Payer: 59 | Admitting: Obstetrics & Gynecology

## 2024-01-14 ENCOUNTER — Other Ambulatory Visit: Payer: Self-pay | Admitting: Internal Medicine

## 2024-01-14 DIAGNOSIS — Z1231 Encounter for screening mammogram for malignant neoplasm of breast: Secondary | ICD-10-CM

## 2024-02-22 ENCOUNTER — Ambulatory Visit
Admission: RE | Admit: 2024-02-22 | Discharge: 2024-02-22 | Disposition: A | Discharge status: Home / Self Care | Source: Ambulatory Visit | Attending: Internal Medicine | Admitting: Internal Medicine

## 2024-02-22 DIAGNOSIS — Z1231 Encounter for screening mammogram for malignant neoplasm of breast: Secondary | ICD-10-CM

## 2024-11-14 ENCOUNTER — Telehealth: Payer: Self-pay

## 2024-11-14 NOTE — Telephone Encounter (Signed)
 Attempted to reach patient concerning colonoscopy recall; unable to speak with patient;  phone just rings then ends; will attempt to reach patient at a later date/time;

## 2024-11-17 NOTE — Telephone Encounter (Signed)
 Attempted to reach patient concerning colonoscopy recall; unable to speak with patient;  left message and number to the office for patient to call back and schedule appts;

## 2024-11-18 ENCOUNTER — Encounter: Payer: Self-pay | Admitting: Gastroenterology

## 2024-12-05 ENCOUNTER — Encounter: Payer: Self-pay | Admitting: Gastroenterology

## 2024-12-05 ENCOUNTER — Ambulatory Visit

## 2024-12-05 VITALS — Ht 64.0 in | Wt 125.0 lb

## 2024-12-05 DIAGNOSIS — Z1211 Encounter for screening for malignant neoplasm of colon: Secondary | ICD-10-CM

## 2024-12-05 MED ORDER — NA SULFATE-K SULFATE-MG SULF 17.5-3.13-1.6 GM/177ML PO SOLN: 1.0000 | Freq: Once | ORAL | 0 refills | Status: AC

## 2024-12-19 ENCOUNTER — Encounter: Admitting: Gastroenterology
